# Patient Record
Sex: Female | Born: 1990
Health system: Southern US, Community
[De-identification: ages and names within clinical notes are randomized; demographics above are authoritative.]

## PROBLEM LIST (undated history)

## (undated) ENCOUNTER — Inpatient Hospital Stay (HOSPITAL_COMMUNITY): Payer: Self-pay

## (undated) DIAGNOSIS — K5190 Ulcerative colitis, unspecified, without complications: Secondary | ICD-10-CM

## (undated) DIAGNOSIS — O26643 Intrahepatic cholestasis of pregnancy, third trimester: Secondary | ICD-10-CM

## (undated) DIAGNOSIS — Z8619 Personal history of other infectious and parasitic diseases: Secondary | ICD-10-CM

## (undated) DIAGNOSIS — K56609 Unspecified intestinal obstruction, unspecified as to partial versus complete obstruction: Secondary | ICD-10-CM

## (undated) DIAGNOSIS — F419 Anxiety disorder, unspecified: Secondary | ICD-10-CM

## (undated) DIAGNOSIS — O26613 Liver and biliary tract disorders in pregnancy, third trimester: Secondary | ICD-10-CM

## (undated) DIAGNOSIS — K602 Anal fissure, unspecified: Secondary | ICD-10-CM

## (undated) DIAGNOSIS — K589 Irritable bowel syndrome without diarrhea: Secondary | ICD-10-CM

## (undated) DIAGNOSIS — K831 Obstruction of bile duct: Secondary | ICD-10-CM

## (undated) DIAGNOSIS — G8929 Other chronic pain: Secondary | ICD-10-CM

## (undated) DIAGNOSIS — R519 Headache, unspecified: Secondary | ICD-10-CM

## (undated) HISTORY — DX: Anxiety disorder, unspecified: F41.9

## (undated) HISTORY — DX: Irritable bowel syndrome, unspecified: K58.9

## (undated) HISTORY — DX: Intrahepatic cholestasis of pregnancy, third trimester: O26.643

## (undated) HISTORY — DX: Anal fissure, unspecified: K60.2

## (undated) HISTORY — PX: OTHER SURGICAL HISTORY: SHX169

## (undated) HISTORY — DX: Ulcerative colitis, unspecified, without complications: K51.90

## (undated) HISTORY — DX: Personal history of other infectious and parasitic diseases: Z86.19

## (undated) HISTORY — DX: Other chronic pain: G89.29

## (undated) HISTORY — DX: Unspecified intestinal obstruction, unspecified as to partial versus complete obstruction: K56.609

## (undated) HISTORY — DX: Headache, unspecified: R51.9

## (undated) HISTORY — DX: Obstruction of bile duct: O26.613

## (undated) HISTORY — DX: Obstruction of bile duct: K83.1

---
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
## 2011-02-06 LAB — POCT URINALYSIS DIP (DEVICE)
Bilirubin Urine: NEGATIVE
Glucose, UA: NEGATIVE
Nitrite: NEGATIVE
Operator id: 247071
Specific Gravity, Urine: 1.01
Urobilinogen, UA: 1

## 2011-02-06 LAB — POCT PREGNANCY, URINE: Preg Test, Ur: NEGATIVE

## 2014-04-27 NOTE — L&D Delivery Note (Signed)
Delivery Note At 6:53 PM a viable female was delivered via  (Presentation: ;  ).  APGAR: , ; weight  .   Placenta status: , .  Cord:  with the following complications: .  Cord pH: not done  Anesthesia:   Episiotomy:   Lacerations:   Suture Repair: 2.0 vicryl Est. Blood Loss (mL):    Mom to postpartum.  Baby to Couplet care / Skin to Skin.  MARSHALL,BERNARD A 12/10/2014, 7:05 PM

## 2014-05-31 ENCOUNTER — Other Ambulatory Visit (HOSPITAL_COMMUNITY): Payer: Self-pay | Admitting: Obstetrics

## 2014-05-31 ENCOUNTER — Encounter (HOSPITAL_COMMUNITY): Payer: Self-pay

## 2014-05-31 ENCOUNTER — Ambulatory Visit (HOSPITAL_COMMUNITY)
Admission: RE | Admit: 2014-05-31 | Discharge: 2014-05-31 | Disposition: A | Discharge status: Home / Self Care | Payer: Self-pay | Source: Ambulatory Visit | Attending: Obstetrics | Admitting: Obstetrics

## 2014-05-31 DIAGNOSIS — R7611 Nonspecific reaction to tuberculin skin test without active tuberculosis: Secondary | ICD-10-CM | POA: Insufficient documentation

## 2014-05-31 DIAGNOSIS — J8410 Pulmonary fibrosis, unspecified: Secondary | ICD-10-CM | POA: Insufficient documentation

## 2014-05-31 DIAGNOSIS — O9989 Other specified diseases and conditions complicating pregnancy, childbirth and the puerperium: Secondary | ICD-10-CM | POA: Insufficient documentation

## 2014-05-31 DIAGNOSIS — Z3A08 8 weeks gestation of pregnancy: Secondary | ICD-10-CM | POA: Insufficient documentation

## 2014-05-31 DIAGNOSIS — Z111 Encounter for screening for respiratory tuberculosis: Secondary | ICD-10-CM

## 2014-05-31 LAB — OB RESULTS CONSOLE ABO/RH: RH TYPE: POSITIVE

## 2014-05-31 LAB — OB RESULTS CONSOLE GC/CHLAMYDIA
CHLAMYDIA, DNA PROBE: NEGATIVE
GC PROBE AMP, GENITAL: NEGATIVE

## 2014-05-31 LAB — OB RESULTS CONSOLE RUBELLA ANTIBODY, IGM: Rubella: IMMUNE

## 2014-05-31 LAB — OB RESULTS CONSOLE RPR: RPR: NONREACTIVE

## 2014-05-31 LAB — OB RESULTS CONSOLE HEPATITIS B SURFACE ANTIGEN: Hepatitis B Surface Ag: NEGATIVE

## 2014-05-31 LAB — OB RESULTS CONSOLE HIV ANTIBODY (ROUTINE TESTING): HIV: NONREACTIVE

## 2014-05-31 LAB — OB RESULTS CONSOLE ANTIBODY SCREEN: ANTIBODY SCREEN: NEGATIVE

## 2014-07-21 ENCOUNTER — Encounter (HOSPITAL_COMMUNITY): Payer: Self-pay

## 2014-07-21 ENCOUNTER — Inpatient Hospital Stay (HOSPITAL_COMMUNITY)
Admission: AD | Admit: 2014-07-21 | Discharge: 2014-07-21 | Disposition: A | Discharge status: Home / Self Care | Payer: Self-pay | Source: Ambulatory Visit | Attending: Obstetrics | Admitting: Obstetrics

## 2014-07-21 DIAGNOSIS — O9989 Other specified diseases and conditions complicating pregnancy, childbirth and the puerperium: Secondary | ICD-10-CM | POA: Insufficient documentation

## 2014-07-21 DIAGNOSIS — Z3492 Encounter for supervision of normal pregnancy, unspecified, second trimester: Secondary | ICD-10-CM

## 2014-07-21 DIAGNOSIS — Z3A16 16 weeks gestation of pregnancy: Secondary | ICD-10-CM | POA: Insufficient documentation

## 2014-07-21 DIAGNOSIS — J029 Acute pharyngitis, unspecified: Secondary | ICD-10-CM

## 2014-07-21 LAB — URINALYSIS, ROUTINE W REFLEX MICROSCOPIC
BILIRUBIN URINE: NEGATIVE
Glucose, UA: NEGATIVE mg/dL
HGB URINE DIPSTICK: NEGATIVE
KETONES UR: NEGATIVE mg/dL
Leukocytes, UA: NEGATIVE
NITRITE: NEGATIVE
PH: 6 (ref 5.0–8.0)
PROTEIN: NEGATIVE mg/dL
Urobilinogen, UA: 0.2 mg/dL (ref 0.0–1.0)

## 2014-07-21 LAB — RAPID STREP SCREEN (MED CTR MEBANE ONLY): Streptococcus, Group A Screen (Direct): NEGATIVE

## 2014-07-21 MED ORDER — IBUPROFEN 600 MG PO TABS
600.0000 mg | ORAL_TABLET | Freq: Once | ORAL | Status: AC
  Administered 2014-07-21: 600 mg via ORAL
  Filled 2014-07-21: qty 1

## 2014-07-21 NOTE — Discharge Instructions (Signed)
Pharyngitis  Pharyngitis is a sore throat (pharynx). There is redness, pain, and swelling of your throat.  HOME CARE   · Drink enough fluids to keep your pee (urine) clear or pale yellow.  · Only take medicine as told by your doctor.  · You may get sick again if you do not take medicine as told. Finish your medicines, even if you start to feel better.  · Do not take aspirin.  · Rest.  · Rinse your mouth (gargle) with salt water (½ tsp of salt per 1 qt of water) every 1-2 hours. This will help the pain.  · If you are not at risk for choking, you can suck on hard candy or sore throat lozenges.  GET HELP IF:  · You have large, tender lumps on your neck.  · You have a rash.  · You cough up green, yellow-brown, or bloody spit.  GET HELP RIGHT AWAY IF:   · You have a stiff neck.  · You drool or cannot swallow liquids.  · You throw up (vomit) or are not able to keep medicine or liquids down.  · You have very bad pain that does not go away with medicine.  · You have problems breathing (not from a stuffy nose).  MAKE SURE YOU:   · Understand these instructions.  · Will watch your condition.  · Will get help right away if you are not doing well or get worse.  Document Released: 09/30/2007 Document Revised: 02/01/2013 Document Reviewed: 12/19/2012  ExitCare® Patient Information ©2015 [employer]. This information is not intended to replace advice given to you by your health care provider. Make sure you discuss any questions you have with your health care provider.  Strep Throat  Strep throat is an infection of the throat caused by a bacteria named Streptococcus pyogenes. Your health care provider may call the infection streptococcal "tonsillitis" or "pharyngitis" depending on whether there are signs of inflammation in the tonsils or back of the throat. Strep throat is most common in children aged 5-15 years during the cold months of the year, but it can occur in people of any age during any season. This infection is spread  from person to person (contagious) through coughing, sneezing, or other close contact.  SIGNS AND SYMPTOMS   · Fever or chills.  · Painful, swollen, red tonsils or throat.  · Pain or difficulty when swallowing.  · White or yellow spots on the tonsils or throat.  · Swollen, tender lymph nodes or "glands" of the neck or under the jaw.  · Red rash all over the body (rare).  DIAGNOSIS   Many different infections can cause the same symptoms. A test must be done to confirm the diagnosis so the right treatment can be given. A "rapid strep test" can help your health care provider make the diagnosis in a few minutes. If this test is not available, a light swab of the infected area can be used for a throat culture test. If a throat culture test is done, results are usually available in a day or two.  TREATMENT   Strep throat is treated with antibiotic medicine.  HOME CARE INSTRUCTIONS   · Gargle with 1 tsp of salt in 1 cup of warm water, 3-4 times per day or as needed for comfort.  · Family members who also have a sore throat or fever should be tested for strep throat and treated with antibiotics if they have the strep infection.  ·   throat gets better.  Drink enough water and fluids to keep your urine clear or pale yellow. This will help prevent dehydration.  Get plenty of rest.  Stay home from school, day care, or work until you have been on antibiotics for 24 hours.  Take medicines only as directed by your health care provider.  Take your antibiotic medicine as directed by your health care provider. Finish it even if you start to feel better. SEEK MEDICAL CARE IF:   The glands in your neck continue to enlarge.  You develop a rash, cough, or earache.  You cough up green, yellow-brown, or  bloody sputum.  You have pain or discomfort not controlled by medicines.  Your problems seem to be getting worse rather than better.  You have a fever. SEEK IMMEDIATE MEDICAL CARE IF:   You develop any new symptoms such as vomiting, severe headache, stiff or painful neck, chest pain, shortness of breath, or trouble swallowing.  You develop severe throat pain, drooling, or changes in your voice.  You develop swelling of the neck, or the skin on the neck becomes red and tender.  You develop signs of dehydration, such as fatigue, dry mouth, and decreased urination.  You become increasingly sleepy, or you cannot wake up completely. MAKE SURE YOU:  Understand these instructions.  Will watch your condition.  Will get help right away if you are not doing well or get worse. Document Released: 04/10/2000 Document Revised: 08/28/2013 Document Reviewed: 06/12/2010 [facility] Patient Information 2015 [employer], [place]. This information is not intended to replace advice given to you by your health care provider. Make sure you discuss any questions you have with your health care provider.

## 2014-07-21 NOTE — MAU Provider Note (Signed)
  History     CSN: [id number]  Arrival date and time: 07/21/14 1253   First Provider Initiated Contact with Patient 07/21/14 1350      Chief Complaint  Patient presents with  . Sore Throat   HPI Alicia Cochran is 24 y.o. G1P0 2400w0d weeks presenting with sore throat that began 3 days ago.  Feels like the inside of her throat is swollen.  Warm drink helps at the time she drinks but then pain return.  Rates pain as 8/10. Took Tylenol X 1 yesterday without any sxs relief. Difficulty sleeping because of pain.   She is a patient of Dr. Gaynell FaceMarshall, has next appointment 3/29.  Denies runny nose, fever, itchy eyes.      History reviewed. No pertinent past medical history.  History reviewed. No pertinent past surgical history.  History reviewed. No pertinent family history.  History  Substance Use Topics  . Smoking status: Never Smoker   . Smokeless tobacco: Never Used  . Alcohol Use: No    Allergies: No Known Allergies  No prescriptions prior to admission    Review of Systems  Constitutional: Negative for fever and chills.  HENT: Positive for sore throat.   Eyes: Negative for discharge and redness.  Respiratory: Negative for cough and shortness of breath.   Gastrointestinal: Negative for abdominal pain.  Neurological: Negative for headaches.   Physical Exam   Blood pressure 109/65, pulse 81, temperature 98.7 F (37.1 C), temperature source Oral, resp. rate 18, height 4\' 11"  (1.499 m), weight 113 lb (51.256 kg).  Physical Exam  Nursing note and vitals reviewed. Constitutional: She appears well-developed and well-nourished. No distress.  HENT:  Head: Normocephalic.  Nose: No rhinorrhea.  Mouth/Throat: Mucous membranes are pale, not dry and not cyanotic. No uvula swelling. Posterior oropharyngeal erythema present. No oropharyngeal exudate, posterior oropharyngeal edema or tonsillar abscesses.  Neck: Normal range of motion.  Cardiovascular: Normal rate.   Respiratory:  Effort normal.  GI: Soft.   URINALYSIS:  SP 1.005, Neg RAPID STREPT SCREEN:  NEG MAU Course  Procedures  STept testing in progress                       Ibuprofen 600mg  po X 1 dose given in MAU-patient instructed to take Tylenol at home.  MDM Will discharge to home with instructions that we will call with Stept test results.  Will give 1 dose of Ibuprofen 600mg  po here.  May use throat lozengers. Warm salt water gargles.  Report worsening sxs to Dr. Gaynell FaceMarshall.     Assessment and Plan  A:  Pharyngitis--Strept was ruled out      Seasonal allergies      [redacted] weeks gestation without pregnancy related sxs  P:  Strept testing will be reported       Warm water gargle, OTC lozengers      Tylenol for pain  16:10 Reported Neg Strept results to patient. KEY,EVE M 07/21/2014, 4:05 PM

## 2014-07-21 NOTE — MAU Note (Signed)
Pt states here for sore throat x3 days as well as swollen lymph nodes. Denies other issues with pregnancy. No bleeding or vaginal discharge.

## 2014-07-23 ENCOUNTER — Encounter (HOSPITAL_COMMUNITY): Payer: Self-pay

## 2014-07-25 LAB — CULTURE, GROUP A STREP: Strep A Culture: NEGATIVE

## 2014-11-26 ENCOUNTER — Inpatient Hospital Stay (HOSPITAL_COMMUNITY): Admission: AD | Admit: 2014-11-26 | Payer: Self-pay | Source: Ambulatory Visit | Admitting: Obstetrics

## 2014-11-26 ENCOUNTER — Ambulatory Visit: Payer: Self-pay | Attending: Internal Medicine

## 2014-12-03 ENCOUNTER — Other Ambulatory Visit (HOSPITAL_COMMUNITY): Payer: Self-pay | Admitting: Obstetrics

## 2014-12-03 DIAGNOSIS — O26643 Intrahepatic cholestasis of pregnancy, third trimester: Secondary | ICD-10-CM

## 2014-12-03 DIAGNOSIS — K831 Obstruction of bile duct: Secondary | ICD-10-CM

## 2014-12-03 DIAGNOSIS — O26613 Liver and biliary tract disorders in pregnancy, third trimester: Secondary | ICD-10-CM

## 2014-12-03 DIAGNOSIS — Z3A35 35 weeks gestation of pregnancy: Secondary | ICD-10-CM

## 2014-12-03 LAB — OB RESULTS CONSOLE GBS: STREP GROUP B AG: POSITIVE

## 2014-12-04 ENCOUNTER — Telehealth (HOSPITAL_COMMUNITY): Payer: Self-pay | Admitting: *Deleted

## 2014-12-04 ENCOUNTER — Ambulatory Visit (HOSPITAL_COMMUNITY)
Admission: RE | Admit: 2014-12-04 | Discharge: 2014-12-04 | Disposition: A | Discharge status: Home / Self Care | Payer: Self-pay | Source: Ambulatory Visit | Attending: Obstetrics | Admitting: Obstetrics

## 2014-12-04 ENCOUNTER — Encounter (HOSPITAL_COMMUNITY): Payer: Self-pay

## 2014-12-04 ENCOUNTER — Encounter (HOSPITAL_COMMUNITY): Payer: Self-pay | Admitting: *Deleted

## 2014-12-04 DIAGNOSIS — Z3A00 Weeks of gestation of pregnancy not specified: Secondary | ICD-10-CM | POA: Insufficient documentation

## 2014-12-04 DIAGNOSIS — Z3493 Encounter for supervision of normal pregnancy, unspecified, third trimester: Secondary | ICD-10-CM | POA: Insufficient documentation

## 2014-12-04 NOTE — Telephone Encounter (Signed)
Preadmission screen Interpreter number [phone]

## 2014-12-07 ENCOUNTER — Ambulatory Visit (HOSPITAL_COMMUNITY)
Admission: RE | Admit: 2014-12-07 | Discharge: 2014-12-07 | Disposition: A | Discharge status: Home / Self Care | Payer: Self-pay | Source: Ambulatory Visit | Attending: Obstetrics | Admitting: Obstetrics

## 2014-12-07 DIAGNOSIS — Z3A35 35 weeks gestation of pregnancy: Secondary | ICD-10-CM | POA: Insufficient documentation

## 2014-12-07 DIAGNOSIS — O26613 Liver and biliary tract disorders in pregnancy, third trimester: Secondary | ICD-10-CM | POA: Insufficient documentation

## 2014-12-07 DIAGNOSIS — K831 Obstruction of bile duct: Secondary | ICD-10-CM

## 2014-12-10 ENCOUNTER — Encounter (HOSPITAL_COMMUNITY): Payer: Self-pay

## 2014-12-10 ENCOUNTER — Inpatient Hospital Stay (HOSPITAL_COMMUNITY): Payer: Medicaid Other | Admitting: Anesthesiology

## 2014-12-10 ENCOUNTER — Inpatient Hospital Stay (HOSPITAL_COMMUNITY)
Admission: RE | Admit: 2014-12-10 | Discharge: 2014-12-12 | DRG: 775 | Disposition: A | Discharge status: Home / Self Care | Payer: Medicaid Other | Source: Ambulatory Visit | Attending: Obstetrics | Admitting: Obstetrics

## 2014-12-10 DIAGNOSIS — O99824 Streptococcus B carrier state complicating childbirth: Principal | ICD-10-CM | POA: Diagnosis present

## 2014-12-10 DIAGNOSIS — K831 Obstruction of bile duct: Secondary | ICD-10-CM | POA: Diagnosis present

## 2014-12-10 DIAGNOSIS — O26619 Liver and biliary tract disorders in pregnancy, unspecified trimester: Secondary | ICD-10-CM

## 2014-12-10 DIAGNOSIS — O2662 Liver and biliary tract disorders in childbirth: Secondary | ICD-10-CM | POA: Diagnosis present

## 2014-12-10 DIAGNOSIS — Z3A37 37 weeks gestation of pregnancy: Secondary | ICD-10-CM | POA: Diagnosis present

## 2014-12-10 LAB — ABO/RH: ABO/RH(D): O POS

## 2014-12-10 LAB — CBC
HEMATOCRIT: 40.1 % (ref 36.0–46.0)
HEMOGLOBIN: 14.2 g/dL (ref 12.0–15.0)
MCH: 33.3 pg (ref 26.0–34.0)
MCHC: 35.4 g/dL (ref 30.0–36.0)
MCV: 94.1 fL (ref 78.0–100.0)
Platelets: 150 10*3/uL (ref 150–400)
RBC: 4.26 MIL/uL (ref 3.87–5.11)
RDW: 14.4 % (ref 11.5–15.5)
WBC: 9.4 10*3/uL (ref 4.0–10.5)

## 2014-12-10 LAB — TYPE AND SCREEN
ABO/RH(D): O POS
ANTIBODY SCREEN: NEGATIVE

## 2014-12-10 LAB — RPR: RPR: NONREACTIVE

## 2014-12-10 MED ORDER — SENNOSIDES-DOCUSATE SODIUM 8.6-50 MG PO TABS
2.0000 | ORAL_TABLET | ORAL | Status: DC
  Administered 2014-12-10 – 2014-12-11 (×2): 2 via ORAL
  Filled 2014-12-10 (×2): qty 2

## 2014-12-10 MED ORDER — ONDANSETRON HCL 4 MG PO TABS: 4.0000 mg | ORAL_TABLET | ORAL | Status: DC | PRN

## 2014-12-10 MED ORDER — ACETAMINOPHEN 325 MG PO TABS
650.0000 mg | ORAL_TABLET | ORAL | Status: DC | PRN
  Administered 2014-12-10 – 2014-12-11 (×2): 650 mg via ORAL
  Filled 2014-12-10 (×2): qty 2

## 2014-12-10 MED ORDER — LANOLIN HYDROUS EX OINT: TOPICAL_OINTMENT | CUTANEOUS | Status: DC | PRN

## 2014-12-10 MED ORDER — DIPHENHYDRAMINE HCL 25 MG PO CAPS: 25.0000 mg | ORAL_CAPSULE | Freq: Four times a day (QID) | ORAL | Status: DC | PRN

## 2014-12-10 MED ORDER — PENICILLIN G POTASSIUM 5000000 UNITS IJ SOLR
5.0000 10*6.[IU] | Freq: Once | INTRAMUSCULAR | Status: AC
  Administered 2014-12-10: 5 10*6.[IU] via INTRAVENOUS
  Filled 2014-12-10: qty 5

## 2014-12-10 MED ORDER — LACTATED RINGERS IV SOLN
500.0000 mL | INTRAVENOUS | Status: DC | PRN
  Administered 2014-12-10: 1000 mL via INTRAVENOUS

## 2014-12-10 MED ORDER — FENTANYL 2.5 MCG/ML BUPIVACAINE 1/10 % EPIDURAL INFUSION (WH - ANES): 12.0000 mL/h | INTRAMUSCULAR | Status: DC | PRN

## 2014-12-10 MED ORDER — FLEET ENEMA 7-19 GM/118ML RE ENEM: 1.0000 | ENEMA | RECTAL | Status: DC | PRN

## 2014-12-10 MED ORDER — WITCH HAZEL-GLYCERIN EX PADS: 1.0000 "application " | MEDICATED_PAD | CUTANEOUS | Status: DC | PRN

## 2014-12-10 MED ORDER — ONDANSETRON HCL 4 MG/2ML IJ SOLN: 4.0000 mg | INTRAMUSCULAR | Status: DC | PRN

## 2014-12-10 MED ORDER — ZOLPIDEM TARTRATE 5 MG PO TABS: 5.0000 mg | ORAL_TABLET | Freq: Every evening | ORAL | Status: DC | PRN

## 2014-12-10 MED ORDER — FENTANYL 2.5 MCG/ML BUPIVACAINE 1/10 % EPIDURAL INFUSION (WH - ANES)
14.0000 mL/h | INTRAMUSCULAR | Status: DC | PRN
  Filled 2014-12-10: qty 125

## 2014-12-10 MED ORDER — TERBUTALINE SULFATE 1 MG/ML IJ SOLN: 0.2500 mg | Freq: Once | INTRAMUSCULAR | Status: DC | PRN

## 2014-12-10 MED ORDER — ACETAMINOPHEN 325 MG PO TABS: 650.0000 mg | ORAL_TABLET | ORAL | Status: DC | PRN

## 2014-12-10 MED ORDER — PROMETHAZINE HCL 25 MG/ML IJ SOLN: 25.0000 mg | Freq: Four times a day (QID) | INTRAMUSCULAR | Status: DC | PRN

## 2014-12-10 MED ORDER — OXYTOCIN BOLUS FROM INFUSION
500.0000 mL | INTRAVENOUS | Status: DC
  Administered 2014-12-10: 500 mL via INTRAVENOUS

## 2014-12-10 MED ORDER — ONDANSETRON HCL 4 MG/2ML IJ SOLN: 4.0000 mg | Freq: Four times a day (QID) | INTRAMUSCULAR | Status: DC | PRN

## 2014-12-10 MED ORDER — DIPHENHYDRAMINE HCL 50 MG/ML IJ SOLN: 12.5000 mg | INTRAMUSCULAR | Status: DC | PRN

## 2014-12-10 MED ORDER — LACTATED RINGERS IV SOLN
INTRAVENOUS | Status: DC
  Administered 2014-12-10 (×3): via INTRAVENOUS

## 2014-12-10 MED ORDER — FENTANYL 2.5 MCG/ML BUPIVACAINE 1/10 % EPIDURAL INFUSION (WH - ANES)
INTRAMUSCULAR | Status: DC | PRN
  Administered 2014-12-10: 12 mL/h via EPIDURAL

## 2014-12-10 MED ORDER — NALBUPHINE HCL 10 MG/ML IJ SOLN: 10.0000 mg | INTRAMUSCULAR | Status: DC | PRN

## 2014-12-10 MED ORDER — OXYCODONE-ACETAMINOPHEN 5-325 MG PO TABS
2.0000 | ORAL_TABLET | ORAL | Status: DC | PRN
Start: 2014-12-10 — End: 2014-12-12

## 2014-12-10 MED ORDER — TETANUS-DIPHTH-ACELL PERTUSSIS 5-2.5-18.5 LF-MCG/0.5 IM SUSP: 0.5000 mL | Freq: Once | INTRAMUSCULAR | Status: DC

## 2014-12-10 MED ORDER — OXYCODONE-ACETAMINOPHEN 5-325 MG PO TABS: 2.0000 | ORAL_TABLET | ORAL | Status: DC | PRN

## 2014-12-10 MED ORDER — LIDOCAINE HCL (PF) 1 % IJ SOLN
30.0000 mL | INTRAMUSCULAR | Status: AC | PRN
  Administered 2014-12-10: 30 mL via SUBCUTANEOUS
  Filled 2014-12-10: qty 30

## 2014-12-10 MED ORDER — OXYCODONE-ACETAMINOPHEN 5-325 MG PO TABS: 1.0000 | ORAL_TABLET | ORAL | Status: DC | PRN

## 2014-12-10 MED ORDER — SIMETHICONE 80 MG PO CHEW: 80.0000 mg | CHEWABLE_TABLET | ORAL | Status: DC | PRN

## 2014-12-10 MED ORDER — PRENATAL MULTIVITAMIN CH
1.0000 | ORAL_TABLET | Freq: Every day | ORAL | Status: DC
  Administered 2014-12-11 – 2014-12-12 (×2): 1 via ORAL
  Filled 2014-12-10 (×2): qty 1

## 2014-12-10 MED ORDER — DIBUCAINE 1 % RE OINT: 1.0000 "application " | TOPICAL_OINTMENT | RECTAL | Status: DC | PRN

## 2014-12-10 MED ORDER — LIDOCAINE HCL (PF) 1 % IJ SOLN
INTRAMUSCULAR | Status: DC | PRN
  Administered 2014-12-10: 5 mL
  Administered 2014-12-10: 4 mL

## 2014-12-10 MED ORDER — CITRIC ACID-SODIUM CITRATE 334-500 MG/5ML PO SOLN: 30.0000 mL | ORAL | Status: DC | PRN

## 2014-12-10 MED ORDER — PHENYLEPHRINE 40 MCG/ML (10ML) SYRINGE FOR IV PUSH (FOR BLOOD PRESSURE SUPPORT)
80.0000 ug | PREFILLED_SYRINGE | INTRAVENOUS | Status: DC | PRN
  Filled 2014-12-10: qty 20

## 2014-12-10 MED ORDER — BENZOCAINE-MENTHOL 20-0.5 % EX AERO
1.0000 "application " | INHALATION_SPRAY | CUTANEOUS | Status: DC | PRN
  Administered 2014-12-10 – 2014-12-12 (×2): 1 via TOPICAL
  Filled 2014-12-10 (×2): qty 56

## 2014-12-10 MED ORDER — PENICILLIN G POTASSIUM 5000000 UNITS IJ SOLR
2.5000 10*6.[IU] | INTRAVENOUS | Status: DC
  Administered 2014-12-10 (×3): 2.5 10*6.[IU] via INTRAVENOUS
  Filled 2014-12-10 (×6): qty 2.5

## 2014-12-10 MED ORDER — OXYCODONE-ACETAMINOPHEN 5-325 MG PO TABS
1.0000 | ORAL_TABLET | ORAL | Status: DC | PRN
Start: 2014-12-10 — End: 2014-12-12

## 2014-12-10 MED ORDER — FERROUS SULFATE 325 (65 FE) MG PO TABS
325.0000 mg | ORAL_TABLET | Freq: Two times a day (BID) | ORAL | Status: DC
  Administered 2014-12-11 – 2014-12-12 (×3): 325 mg via ORAL
  Filled 2014-12-10 (×3): qty 1

## 2014-12-10 MED ORDER — OXYTOCIN 40 UNITS IN LACTATED RINGERS INFUSION - SIMPLE MED
1.0000 m[IU]/min | INTRAVENOUS | Status: DC
  Administered 2014-12-10: 1 m[IU]/min via INTRAVENOUS
  Filled 2014-12-10: qty 1000

## 2014-12-10 MED ORDER — IBUPROFEN 600 MG PO TABS
600.0000 mg | ORAL_TABLET | Freq: Four times a day (QID) | ORAL | Status: DC
  Administered 2014-12-10 – 2014-12-12 (×7): 600 mg via ORAL
  Filled 2014-12-10 (×7): qty 1

## 2014-12-10 MED ORDER — EPHEDRINE 5 MG/ML INJ: 10.0000 mg | INTRAVENOUS | Status: DC | PRN

## 2014-12-10 MED ORDER — OXYTOCIN 40 UNITS IN LACTATED RINGERS INFUSION - SIMPLE MED
62.5000 mL/h | INTRAVENOUS | Status: DC
  Administered 2014-12-10: 62.5 mL/h via INTRAVENOUS

## 2014-12-10 NOTE — H&P (Signed)
This is Dr. Francoise Ceo dictating the history and physical on  Alicia Cochran  she is a 24 year old gravida 2 para 0010 at 37 weeks and 3 days EDC 92 positive GBS for which she received penicillin patient has cholestasis of pregnancy increased bile acids and was followed by maternal-fetal medicine with nonstress test and she's in for induction cervix 2 cm 80% vertex -1 and amniotomy performed the fluids clear she is on low-dose Pitocin she was also on Atarax 10 mg by mouth 4 times a day for itching Past surgical history negative Past medical history negative Social history no smoking drinking or alcohol use System review patient has had generalized itching for the past couple weeks and is on Atarax 10 mg every 4 hours Family history negative Physical exam well-developed female in no distress HEENT negative Lungs clear to P&A Heart regular rhythm no murmurs or gallops Breasts negative Abdomen term Pelvic as described above Extremities negative

## 2014-12-10 NOTE — Anesthesia Preprocedure Evaluation (Signed)
Anesthesia Evaluation  Patient identified by MRN, date of birth, ID band Patient awake and Patient confused    Reviewed: Allergy & Precautions, H&P , NPO status , Patient's Chart, lab work & pertinent test results  Airway Mallampati: II       Dental   Pulmonary  breath sounds clear to auscultation  Pulmonary exam normal       Cardiovascular Exercise Tolerance: Good Normal cardiovascular examRhythm:regular Rate:Normal     Neuro/Psych    GI/Hepatic   Endo/Other    Renal/GU      Musculoskeletal   Abdominal   Peds  Hematology   Anesthesia Other Findings   Reproductive/Obstetrics (+) Pregnancy                             Anesthesia Physical Anesthesia Plan  ASA: II  Anesthesia Plan: Epidural   Post-op Pain Management:    Induction:   Airway Management Planned:   Additional Equipment:   Intra-op Plan:   Post-operative Plan:   Informed Consent: I have reviewed the patients History and Physical, chart, labs and discussed the procedure including the risks, benefits and alternatives for the proposed anesthesia with the patient or authorized representative who has indicated his/her understanding and acceptance.     Plan Discussed with:   Anesthesia Plan Comments:         Anesthesia Quick Evaluation  

## 2014-12-10 NOTE — Anesthesia Procedure Notes (Signed)
Epidural Patient location during procedure: OB Start time: 12/10/2014 9:56 AM End time: 12/10/2014 10:10 AM  Staffing Anesthesiologist: Sebastian Ache  Preanesthetic Checklist Completed: patient identified, site marked, surgical consent, pre-op evaluation, timeout performed, IV checked, risks and benefits discussed and monitors and equipment checked  Epidural Patient position: sitting Prep: site prepped and draped and DuraPrep Patient monitoring: heart rate, continuous pulse ox and blood pressure Approach: midline Location: L3-L4 Injection technique: LOR air  Needle:  Needle type: Tuohy  Needle gauge: 17 G Needle length: 9 cm and 9 Needle insertion depth: 5 cm Catheter type: closed end flexible Catheter size: 19 Gauge Catheter at skin depth: 13.5 cm Test dose: negative  Assessment Events: blood not aspirated, injection not painful, no injection resistance, negative IV test and no paresthesia  Additional Notes   Patient tolerated the insertion well without complications.Reason for block:procedure for pain
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
## 2014-12-11 LAB — CBC
HCT: 35.2 % — ABNORMAL LOW (ref 36.0–46.0)
HEMOGLOBIN: 12 g/dL (ref 12.0–15.0)
MCH: 32.3 pg (ref 26.0–34.0)
MCHC: 34.1 g/dL (ref 30.0–36.0)
MCV: 94.6 fL (ref 78.0–100.0)
Platelets: 142 10*3/uL — ABNORMAL LOW (ref 150–400)
RBC: 3.72 MIL/uL — AB (ref 3.87–5.11)
RDW: 14.4 % (ref 11.5–15.5)
WBC: 11 10*3/uL — ABNORMAL HIGH (ref 4.0–10.5)
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
## 2014-12-11 NOTE — Progress Notes (Signed)
Patient ID: Alicia Cochran, female   DOB: January 24, 1991, 24 y.o.   MRN: [id number] Postpartum day one Blood pressure 102/69 respiration 18 pulse 80 afebrile Fundus firm Lochia moderate Legs negative Doing well

## 2014-12-11 NOTE — Anesthesia Postprocedure Evaluation (Signed)
Anesthesia Post Note  Patient: Alicia Cochran  Procedure(s) Performed: * No procedures listed *  Anesthesia type: Epidural  Patient location: Mother/Baby  Post pain: Pain level controlled  Post assessment: Post-op Vital signs reviewed  Last Vitals:  Filed Vitals:   12/11/14 0617  BP: 102/69  Pulse: 80  Temp: 36.6 C  Resp: 18    Post vital signs: Reviewed  Level of consciousness: awake  Complications: No apparent anesthesia complications

## 2014-12-11 NOTE — Lactation Note (Signed)
This note was copied from the chart of Alicia Sloane Palmer. Lactation Consultation Note  Mother resting holding baby in arms.  Exhausted.  Her mother will come to stay w/ her tonight to help w/ baby. Mother knows how to hand express and feels good about breastfeeding. Noted tightness under infants tongue causing some limited mobility not completing covering bottom gum when he sucks. Feels like biting. Performed suck training.  Suggest mother call for assistance w/ next feeding. Reviewed basics, STS  and cluster feeding and suggest she wait on pacifier use. Mom encouraged to feed baby 8-12 times/24 hours and with feeding cues.  Mom made aware of O/P services, breastfeeding support groups, community resources, and our phone # for post-discharge questions.     Patient Name: Alicia Cochran [id number] Date: 12/11/2014 Reason for consult: Initial assessment   Maternal Data Has patient been taught Hand Expression?: Yes Does the patient have breastfeeding experience prior to this delivery?: No  Feeding Feeding Type: Breast Fed Length of feed: 15 min  LATCH Score/Interventions                      Lactation Tools Discussed/Used     Consult Status Consult Status: Follow-up Date: 12/12/14 Follow-up type: In-patient    Dahlia Byes [facility] 12/11/2014, 4:30 PM

## 2014-12-12 NOTE — Progress Notes (Signed)
UR chart review completed.  

## 2014-12-12 NOTE — Discharge Summary (Signed)
Obstetric Discharge Summary Reason for Admission: induction of labor Prenatal Procedures: none Intrapartum Procedures: spontaneous vaginal delivery Postpartum Procedures: none Complications-Operative and Postpartum: none HEMOGLOBIN  Date Value Ref Range Status  12/11/2014 12.0 12.0 - 15.0 g/dL Final   HCT  Date Value Ref Range Status  12/11/2014 35.2* 36.0 - 46.0 % Final    Physical Exam:  General: alert Lochia: appropriate Uterine Fundus: firm Incision: healing well DVT Evaluation: No evidence of DVT seen on physical exam.  Discharge Diagnoses: Term Pregnancy-delivered  Discharge Information: Date: 12/12/2014 Activity: pelvic rest Diet: routine Medications: Percocet Condition: improved Instructions: refer to practice specific booklet Discharge to: home Follow-up Information    Follow up with Massiah Longanecker A, MD In 6 weeks.   Specialty:  Obstetrics and Gynecology   Contact information:   [address] [place] [address] [phone]       Newborn Data: Live born female  Birth Weight: 6 lb 11 oz (3033 g) APGAR: 5, 8  Home with mother.  Katrese Shell A 12/12/2014, 6:40 AM

## 2014-12-12 NOTE — Discharge Instructions (Signed)
Discharge instructions   You can wash your hair  Shower  Eat what you want  Drink what you want  See me in 6 weeks  Your ankles are going to swell more in the next 2 weeks than when pregnant  No sex for 6 weeks   Alicia Cochran A, MD 12/12/2014

## 2014-12-12 NOTE — Progress Notes (Signed)
Patient ID: Alicia Cochran, female   DOB: Jul 17, 1990, 24 y.o.   MRN: [id number] Postpartum day 2 Blood pressure 8755 respiration 16 rhythm and pulse 68 afebrile Fundus firm Lochia moderate Legs negative Doing well home today

## 2014-12-17 ENCOUNTER — Ambulatory Visit (HOSPITAL_COMMUNITY): Payer: Self-pay

## 2014-12-28 ENCOUNTER — Other Ambulatory Visit (HOSPITAL_COMMUNITY): Payer: Self-pay | Admitting: Obstetrics

## 2015-02-01 ENCOUNTER — Encounter: Payer: Self-pay | Admitting: Internal Medicine

## 2015-02-01 ENCOUNTER — Ambulatory Visit: Payer: Medicaid Other | Attending: Internal Medicine | Admitting: Internal Medicine

## 2015-02-01 VITALS — BP 101/63 | HR 65 | Temp 98.3°F | Resp 17 | Ht 61.0 in | Wt 119.6 lb

## 2015-02-01 DIAGNOSIS — L299 Pruritus, unspecified: Secondary | ICD-10-CM | POA: Insufficient documentation

## 2015-02-01 DIAGNOSIS — K831 Obstruction of bile duct: Secondary | ICD-10-CM | POA: Insufficient documentation

## 2015-02-01 DIAGNOSIS — Z2821 Immunization not carried out because of patient refusal: Secondary | ICD-10-CM | POA: Diagnosis not present

## 2015-02-01 DIAGNOSIS — L298 Other pruritus: Secondary | ICD-10-CM | POA: Diagnosis not present

## 2015-02-01 DIAGNOSIS — Z0001 Encounter for general adult medical examination with abnormal findings: Secondary | ICD-10-CM | POA: Insufficient documentation

## 2015-02-01 NOTE — Patient Instructions (Signed)
I will call you once I get blood test back and ultrasound results and let you know what else we need to do. I need lab results from Dr. Hillery Aldo sign release of records.

## 2015-02-01 NOTE — Progress Notes (Signed)
Patient here to establish care and for a physical Patient takes no prescribed medications Patient was diagnosed during her pregnancy with cholestasis and told the  Itching would go away after the birth but still complains of itching and would Like a referral to derm

## 2015-02-01 NOTE — Progress Notes (Signed)
Patient ID: Alicia Cochran, female   DOB: 12-13-1990, 24 y.o.   MRN: [id number]  JYN:[id number]  QMV:[id number]  DOB - [birth year]  CC:  Chief Complaint  Patient presents with  . Annual Exam       HPI: Alicia Cochran is a 24 y.o. female here today to establish medical care. Patient has a past medical history of Cholestasis in pregnancy while in her third trimester. Patient reports that she is not on any current medication and denies any surgical history. Patient is concerned about itching that has persisted since the delivery of her baby 8 weeks ago. She reports that her OB/GYN is Dr. Gaynell Face and on her 6 week follow up she was told that she needed a gallbladder ultrasound but it would be expensive for her. She is requesting further evaluation. She notes that she was given hydroxyzine but stopped taking the medication because it only made her tired.   Patient has No headache, No chest pain, No abdominal pain - No Nausea, No new weakness tingling or numbness, No Cough - SOB.  No Known Allergies Past Medical History  Diagnosis Date  . Hx of chlamydia infection   . Cholestasis of pregnancy in third trimester    No current outpatient prescriptions on file prior to visit.   No current facility-administered medications on file prior to visit.   Family History  Problem Relation Age of Onset  . Asthma Mother   . Diabetes Maternal Grandmother   . Alcohol abuse Neg Hx   . Arthritis Neg Hx   . Birth defects Neg Hx   . Cancer Neg Hx   . COPD Neg Hx   . Depression Neg Hx   . Drug abuse Neg Hx   . Early death Neg Hx   . Hearing loss Neg Hx   . Heart disease Neg Hx   . Hyperlipidemia Neg Hx   . Hypertension Neg Hx   . Kidney disease Neg Hx   . Learning disabilities Neg Hx   . Mental illness Neg Hx   . Mental retardation Neg Hx   . Miscarriages / Stillbirths Neg Hx   . Stroke Neg Hx   . Vision loss Neg Hx   . Varicose Veins Neg Hx    Social History   Social History    . Marital Status: Single    Spouse Name: N/A  . Number of Children: N/A  . Years of Education: N/A   Occupational History  . Not on file.   Social History Main Topics  . Smoking status: Never Smoker   . Smokeless tobacco: Never Used  . Alcohol Use: No  . Drug Use: No  . Sexual Activity: Yes   Other Topics Concern  . Not on file   Social History Narrative   ** Merged History Encounter **        Review of Systems: Constitutional: Negative for fever, chills, diaphoresis, activity change, appetite change and fatigue. HENT: Negative for ear pain, nosebleeds, congestion, facial swelling, rhinorrhea, neck pain, neck stiffness and ear discharge.  Eyes: Negative for pain, discharge, redness, itching and visual disturbance. Respiratory: Negative for cough, choking, chest tightness, shortness of breath, wheezing and stridor.  Cardiovascular: Negative for chest pain, palpitations and leg swelling. Gastrointestinal: Negative for abdominal distention. Genitourinary: Negative for dysuria, urgency, frequency, hematuria, flank pain, decreased urine volume, difficulty urinating and dyspareunia.  Musculoskeletal: Negative for back pain, joint swelling, arthralgia and gait problem. Neurological: Negative for dizziness, tremors, seizures, syncope, facial asymmetry, speech difficulty,  weakness, light-headedness, numbness and headaches.  Hematological: Negative for adenopathy. Does not bruise/bleed easily. Psychiatric/Behavioral: Negative for hallucinations, behavioral problems, confusion, dysphoric mood, decreased concentration and agitation.    Objective:   Filed Vitals:   02/01/15 1126  BP: 101/63  Pulse: 65  Temp: 98.3 F (36.8 C)  Resp: 17    Physical Exam  Constitutional: She is oriented to person, place, and time.  Eyes: No scleral icterus.  Cardiovascular: Normal rate, regular rhythm and normal heart sounds.   Pulmonary/Chest: Effort normal and breath sounds normal.   Abdominal: Soft. Bowel sounds are normal. She exhibits no distension.  No hepatomegaly  Neurological: She is alert and oriented to person, place, and time.  Skin: Skin is warm and dry.  No jaundice     Lab Results  Component Value Date   WBC 11.0* 12/11/2014   HGB 12.0 12/11/2014   HCT 35.2* 12/11/2014   MCV 94.6 12/11/2014   PLT 142* 12/11/2014   No results found for: CREATININE, BUN, NA, K, CL, CO2  No results found for: HGBA1C Lipid Panel  No results found for: CHOL, TRIG, HDL, CHOLHDL, VLDL, LDLCALC     Assessment and plan:   [name] was seen today for annual exam.  Diagnoses and all orders for this visit:  Cholestatic pruritus -     Hepatic Function Panel  Cholestasis -     US Abdomen Limited; Future Will have patient send records to Dr. Elsie Stain to compare lab results.  If levels are still elevated will start patient on cholestyramine or send to GI.  Refused influenza vaccine Educated patient on the importance of vaccination especially with infant in home. May benefit from passive immunity   Return if symptoms worsen or fail to improve.    Ambrose Finland, NP-C [facility] [phone] 02/01/2015, 11:37 AM

## 2015-02-02 LAB — HEPATIC FUNCTION PANEL
ALK PHOS: 91 U/L (ref 33–115)
ALT: 16 U/L (ref 6–29)
AST: 16 U/L (ref 10–30)
Albumin: 4.6 g/dL (ref 3.6–5.1)
BILIRUBIN DIRECT: 0.1 mg/dL (ref <=0.2)
BILIRUBIN TOTAL: 0.4 mg/dL (ref 0.2–1.2)
Indirect Bilirubin: 0.3 mg/dL (ref 0.2–1.2)
Total Protein: 6.9 g/dL (ref 6.1–8.1)

## 2015-02-06 ENCOUNTER — Other Ambulatory Visit: Payer: Self-pay | Admitting: Internal Medicine

## 2015-02-06 ENCOUNTER — Ambulatory Visit (HOSPITAL_COMMUNITY)
Admission: RE | Admit: 2015-02-06 | Discharge: 2015-02-06 | Disposition: A | Discharge status: Home / Self Care | Payer: Self-pay | Source: Ambulatory Visit | Attending: Internal Medicine | Admitting: Internal Medicine

## 2015-02-06 ENCOUNTER — Telehealth: Payer: Self-pay | Admitting: Internal Medicine

## 2015-02-06 DIAGNOSIS — K831 Obstruction of bile duct: Secondary | ICD-10-CM | POA: Insufficient documentation

## 2015-02-06 NOTE — Telephone Encounter (Signed)
Patient called to let PCP know that she had her ultrasound done already. Please f/u

## 2015-02-08 ENCOUNTER — Telehealth: Payer: Self-pay | Admitting: Internal Medicine

## 2015-02-08 ENCOUNTER — Telehealth: Payer: Self-pay

## 2015-02-08 NOTE — Telephone Encounter (Signed)
Patient does speak english Patient is aware of her normal US results

## 2015-02-08 NOTE — Telephone Encounter (Signed)
Patient called to let PCP know that she had her ultrasound done already and would like a call back. Please f/u

## 2015-02-08 NOTE — Telephone Encounter (Signed)
-----   Message from Ambrose FinlandValerie A Keck, NP sent at 02/08/2015 12:30 PM EDT ----- Normal ultrasound. And normal liver function test. Does she still have itching?

## 2015-03-14 ENCOUNTER — Telehealth: Payer: Self-pay | Admitting: Internal Medicine

## 2015-03-14 NOTE — Telephone Encounter (Signed)
Pt. Called requesting to speak to nurse because she sees blood in her bowel movement. Pt. Stated she is scared and does not know what to do. Please f/u with pt ASAP.

## 2015-03-19 ENCOUNTER — Telehealth: Payer: Self-pay

## 2015-03-19 NOTE — Telephone Encounter (Signed)
Returned patient phone call and patient stated she Is better now

## 2015-03-27 ENCOUNTER — Ambulatory Visit: Payer: Self-pay | Admitting: Internal Medicine

## 2016-10-02 ENCOUNTER — Ambulatory Visit: Payer: Self-pay

## 2016-10-02 ENCOUNTER — Ambulatory Visit: Payer: Self-pay | Admitting: Family Medicine

## 2016-10-06 ENCOUNTER — Ambulatory Visit: Payer: Self-pay | Attending: Internal Medicine | Admitting: Physician Assistant

## 2016-10-06 ENCOUNTER — Encounter: Payer: Self-pay | Admitting: Physician Assistant

## 2016-10-06 VITALS — BP 97/60 | HR 85 | Temp 98.5°F | Resp 16 | Ht 59.0 in | Wt 110.0 lb

## 2016-10-06 DIAGNOSIS — R5383 Other fatigue: Secondary | ICD-10-CM | POA: Insufficient documentation

## 2016-10-06 DIAGNOSIS — R1031 Right lower quadrant pain: Secondary | ICD-10-CM | POA: Insufficient documentation

## 2016-10-06 DIAGNOSIS — Z308 Encounter for other contraceptive management: Secondary | ICD-10-CM | POA: Insufficient documentation

## 2016-10-06 DIAGNOSIS — Z3002 Counseling and instruction in natural family planning to avoid pregnancy: Secondary | ICD-10-CM

## 2016-10-06 DIAGNOSIS — N3001 Acute cystitis with hematuria: Secondary | ICD-10-CM | POA: Insufficient documentation

## 2016-10-06 DIAGNOSIS — Z79899 Other long term (current) drug therapy: Secondary | ICD-10-CM | POA: Insufficient documentation

## 2016-10-06 LAB — POCT URINALYSIS DIPSTICK
Bilirubin, UA: NEGATIVE
Glucose, UA: NEGATIVE
Ketones, UA: NEGATIVE
NITRITE UA: NEGATIVE
PH UA: 7 (ref 5.0–8.0)
PROTEIN UA: 30
Spec Grav, UA: 1.01 (ref 1.010–1.025)
UROBILINOGEN UA: 0.2 U/dL

## 2016-10-06 LAB — POCT URINE PREGNANCY: PREG TEST UR: NEGATIVE

## 2016-10-06 MED ORDER — NITROFURANTOIN MONOHYD MACRO 100 MG PO CAPS: 100.0000 mg | ORAL_CAPSULE | Freq: Two times a day (BID) | ORAL | 0 refills | Status: DC

## 2016-10-06 MED ORDER — IBUPROFEN 600 MG PO TABS: 600.0000 mg | ORAL_TABLET | Freq: Three times a day (TID) | ORAL | 0 refills | Status: DC | PRN

## 2016-10-06 MED ORDER — FLUCONAZOLE 150 MG PO TABS: 150.0000 mg | ORAL_TABLET | Freq: Once | ORAL | 0 refills | Status: DC

## 2016-10-06 MED ORDER — FLUCONAZOLE 150 MG PO TABS: 150.0000 mg | ORAL_TABLET | Freq: Once | ORAL | 0 refills | Status: AC

## 2016-10-06 MED ORDER — LEVONORGESTREL-ETHINYL ESTRAD 0.1-20 MG-MCG PO TABS: 1.0000 | ORAL_TABLET | Freq: Every day | ORAL | 11 refills | Status: DC

## 2016-10-06 NOTE — Progress Notes (Signed)
Alicia Cochran, is a 26 y.o. female  BJY:[id number]  [id number]:[id number]  DOB - 1991-01-28  Subjective:  Chief Complaint and HPI: Alicia Cochran is a 26 y.o. female here today for RLQ pain and urinary frequency for 1 month that has worsened over the last week.  She was seen [facility] the health dept 8 days ago and had Pap/STI screening-all of which was negative.  She also says they checked her urine and it did not show infection at the time.  No f/c.  No vaginal discharge.  Pain is worse at night.  She hasn't taken anything for the pain.  Not on [facility] but wants to restart it.    She has also been experiencing some difficulty sleeping and feels fatigued.  Her appetite has been less than normal the last few days.  No N/V/D.  No changes in BM.  No melena/hematochezia.  Social History: works as a Research officer, political party 6 days/week.  Single mom of almost 2 yr old son  ROS:   Constitutional:  No f/c, No night sweats, No unexplained weight loss. EENT:  No vision changes, No blurry vision, No hearing changes. No mouth, throat, or ear problems.  Respiratory: No cough, No SOB Cardiac: No CP, no palpitations GI:  +RL abd pain, No N/V/D. GU: + Urinary s/sx-frequency without dysuria Musculoskeletal: No joint pain Neuro: No headache, no dizziness, no motor weakness.  Skin: No rash Endocrine:  No polydipsia. No polyuria.  Psych: Denies SI/HI  No problems updated.  ALLERGIES: No Known Allergies  PAST MEDICAL HISTORY: Past Medical History:  Diagnosis Date  . Cholestasis of pregnancy in third trimester   . Hx of chlamydia infection     MEDICATIONS AT HOME: Prior to Admission medications   Medication Sig Start Date End Date Taking? Authorizing Provider  fluconazole (DIFLUCAN) 150 MG tablet Take 1 tablet (150 mg total) by mouth once. 10/06/16 10/06/16  Anders Simmonds, PA-C  levonorgestrel-ethinyl estradiol (AVIANE,ALESSE,LESSINA) 0.1-20 MG-MCG tablet Take 1 tablet by mouth daily. 10/06/16   Anders Simmonds, PA-C  nitrofurantoin, macrocrystal-monohydrate, (MACROBID) 100 MG capsule Take 1 capsule (100 mg total) by mouth 2 (two) times daily. 10/06/16   Anders Simmonds, PA-C     Objective:  EXAM:   Vitals:   10/06/16 1126  BP: 97/60  Pulse: 85  Resp: 16  Temp: 98.5 F (36.9 C)  TempSrc: Oral  SpO2: 97%  Weight: 110 lb (49.9 kg)  Height: 4\' 11"  (1.499 m)    General appearance : A&OX3. NAD. Non-toxic-appearing HEENT: Atraumatic and Normocephalic.  PERRLA. EOM intact.  TM clear B. Mouth-MMM, post pharynx WNL w/o erythema, No PND. Neck: supple, no JVD. No cervical lymphadenopathy. No thyromegaly Chest/Lungs:  Breathing-non-labored, Good air entry bilaterally, breath sounds normal without rales, rhonchi, or wheezing  CVS: S1 S2 regular, no murmurs, gallops, rubs  Abdomen: Bowel sounds present, Non tender and not distended with no gaurding, rigidity or rebound.  Neg Murphy's, neg McBurney's.  Mild TTP in RLQ/suprapubic area Pelvic exam deferred bc done 1 week ago. Extremities: Bilateral Lower Ext shows no edema, both legs are warm to touch with = pulse throughout Neurology:  CN II-XII grossly intact, Non focal.   Psych:  TP linear. J/I WNL. Normal speech. Appropriate eye contact and affect.  Skin:  No Rash  Data Review No results found for: HGBA1C   Assessment & Plan   1. RLQ abdominal pain Non-acute abdomen/pelvis on exam - POCT urinalysis dipstick - POCT urine pregnancy - US Pelvis  Complete; Future - Comprehensive metabolic panel - CBC with Differential/Platelet  2. Counseling for birth control, natural family planning All STI and pap normal 1 week ago at HD. - levonorgestrel-ethinyl estradiol (AVIANE,ALESSE,LESSINA) 0.1-20 MG-MCG tablet; Take 1 tablet by mouth daily.  Dispense: 1 Package; Refill: 11  3. Acute cystitis with hematuria - Urine Culture - nitrofurantoin, macrocrystal-monohydrate, (MACROBID) 100 MG capsule; Take 1 capsule (100 mg total) by mouth 2  (two) times daily.  Dispense: 10 capsule; Refill: 0 - fluconazole (DIFLUCAN) 150 MG tablet; Take 1 tablet (150 mg total) by mouth once.  Dispense: 1 tablet; Refill: 0  4. Fatigue, unspecified type - Vitamin D, 25-hydroxy - TSH  Patient have been counseled extensively about nutrition and exercise  Return in about 6 weeks (around 11/17/2016) for assign PCP; f/up pelvic pain/labs.  The patient was given clear instructions to go to ER or return to medical center if symptoms don't improve, worsen or new problems develop. The patient verbalized understanding. The patient was told to call to get lab results if they haven't heard anything in the next week.     Georgian CoAngela McClung, PA-C [facility] [address] [place], [place] [phone]   10/06/2016, 11:50 AMPatient ID: Alicia QuamNathalie Cochran, female   DOB: 23-Oct-1990, 26 y.o.   MRN: [id number]

## 2016-10-07 ENCOUNTER — Other Ambulatory Visit: Payer: Self-pay | Admitting: Physician Assistant

## 2016-10-07 ENCOUNTER — Telehealth: Payer: Self-pay | Admitting: Physician Assistant

## 2016-10-07 DIAGNOSIS — E559 Vitamin D deficiency, unspecified: Secondary | ICD-10-CM

## 2016-10-07 LAB — CBC WITH DIFFERENTIAL/PLATELET
BASOS ABS: 0 10*3/uL (ref 0.0–0.2)
Basos: 0 %
EOS (ABSOLUTE): 0.1 10*3/uL (ref 0.0–0.4)
Eos: 2 %
Hematocrit: 39.7 % (ref 34.0–46.6)
Hemoglobin: 13.2 g/dL (ref 11.1–15.9)
IMMATURE GRANS (ABS): 0 10*3/uL (ref 0.0–0.1)
IMMATURE GRANULOCYTES: 0 %
LYMPHS: 49 %
Lymphocytes Absolute: 2.4 10*3/uL (ref 0.7–3.1)
MCH: 30.7 pg (ref 26.6–33.0)
MCHC: 33.2 g/dL (ref 31.5–35.7)
MCV: 92 fL (ref 79–97)
MONOS ABS: 0.3 10*3/uL (ref 0.1–0.9)
Monocytes: 5 %
Neutrophils Absolute: 2.2 10*3/uL (ref 1.4–7.0)
Neutrophils: 44 %
PLATELETS: 268 10*3/uL (ref 150–379)
RBC: 4.3 x10E6/uL (ref 3.77–5.28)
RDW: 14.2 % (ref 12.3–15.4)
WBC: 5.1 10*3/uL (ref 3.4–10.8)

## 2016-10-07 LAB — COMPREHENSIVE METABOLIC PANEL
A/G RATIO: 2 (ref 1.2–2.2)
ALK PHOS: 60 IU/L (ref 39–117)
ALT: 11 IU/L (ref 0–32)
AST: 13 IU/L (ref 0–40)
Albumin: 4.4 g/dL (ref 3.5–5.5)
BUN/Creatinine Ratio: 12 (ref 9–23)
BUN: 7 mg/dL (ref 6–20)
Bilirubin Total: 0.2 mg/dL (ref 0.0–1.2)
CHLORIDE: 104 mmol/L (ref 96–106)
CO2: 24 mmol/L (ref 20–29)
Calcium: 8.9 mg/dL (ref 8.7–10.2)
Creatinine, Ser: 0.58 mg/dL (ref 0.57–1.00)
GFR calc Af Amer: 148 mL/min/{1.73_m2} (ref >59)
GFR calc non Af Amer: 129 mL/min/{1.73_m2} (ref >59)
GLOBULIN, TOTAL: 2.2 g/dL (ref 1.5–4.5)
Glucose: 85 mg/dL (ref 65–99)
POTASSIUM: 3.8 mmol/L (ref 3.5–5.2)
SODIUM: 140 mmol/L (ref 134–144)
Total Protein: 6.6 g/dL (ref 6.0–8.5)

## 2016-10-07 LAB — VITAMIN D 25 HYDROXY (VIT D DEFICIENCY, FRACTURES): VIT D 25 HYDROXY: 15.9 ng/mL — AB (ref 30.0–100.0)

## 2016-10-07 LAB — TSH: TSH: 2.13 u[IU]/mL (ref 0.450–4.500)

## 2016-10-07 MED ORDER — VITAMIN D (ERGOCALCIFEROL) 1.25 MG (50000 UNIT) PO CAPS: 50000.0000 [IU] | ORAL_CAPSULE | ORAL | 0 refills | Status: DC

## 2016-10-07 NOTE — Telephone Encounter (Signed)
Patient received communication via MyChart stating Vitamin D is low and script was sent to pharmacy, patient would like to discuss in detail with nurse results.  Please follow up

## 2016-10-08 LAB — URINE CULTURE: ORGANISM ID, BACTERIA: NO GROWTH

## 2016-10-08 NOTE — Telephone Encounter (Signed)
MA informed patient of labs being normal except vitamin d being low. Patient advised to pickup Vitamin [id number] prescription from [facility]. Patient is also aware of receiving an additional call regarding the urine culture. Medical Assistant left message on patient's home and cell voicemail. Voicemail states to give a call back to [place] [facility] at [phone].

## 2016-10-08 NOTE — Telephone Encounter (Signed)
-----   Message from Anders SimmondsAngela M McClung, [place] sent at 10/07/2016  8:53 AM EDT ----- Please call patient and let them that their vitamin D is low.  This can contribute to muscle aches, anxiety, fatigue, and depression.  I have sent a prescription to the pharmacy for them to take once a week.  We will recheck this level in 3-4 months.  Her thyroid is normal. Her blood count, electrolytes, kidney function, blood sugar, and liver function are all normal.  I am still awaiting her urine culture results and she should continue her antibiotics. Thanks, Georgian CoAngela McClung, PA-C

## 2016-10-09 ENCOUNTER — Telehealth: Payer: Self-pay | Admitting: Physician Assistant

## 2016-10-09 ENCOUNTER — Ambulatory Visit (HOSPITAL_COMMUNITY)
Admission: RE | Admit: 2016-10-09 | Discharge: 2016-10-09 | Disposition: A | Discharge status: Home / Self Care | Payer: Self-pay | Source: Ambulatory Visit | Attending: Physician Assistant | Admitting: Physician Assistant

## 2016-10-09 DIAGNOSIS — R1031 Right lower quadrant pain: Secondary | ICD-10-CM

## 2016-10-09 NOTE — Telephone Encounter (Signed)
Transvaginal required with pelvic [place]

## 2016-10-12 ENCOUNTER — Other Ambulatory Visit: Payer: Self-pay | Admitting: Physician Assistant

## 2016-10-12 DIAGNOSIS — N3001 Acute cystitis with hematuria: Secondary | ICD-10-CM

## 2016-10-14 ENCOUNTER — Ambulatory Visit: Payer: Self-pay | Attending: Family Medicine | Admitting: Family Medicine

## 2016-10-14 ENCOUNTER — Ambulatory Visit: Payer: Self-pay | Admitting: Family Medicine

## 2016-10-14 VITALS — BP 98/64 | HR 69 | Temp 98.5°F | Resp 18 | Ht 59.0 in | Wt 110.8 lb

## 2016-10-14 DIAGNOSIS — R1031 Right lower quadrant pain: Secondary | ICD-10-CM | POA: Insufficient documentation

## 2016-10-14 DIAGNOSIS — K625 Hemorrhage of anus and rectum: Secondary | ICD-10-CM | POA: Insufficient documentation

## 2016-10-14 DIAGNOSIS — R102 Pelvic and perineal pain: Secondary | ICD-10-CM | POA: Insufficient documentation

## 2016-10-14 DIAGNOSIS — K5900 Constipation, unspecified: Secondary | ICD-10-CM | POA: Insufficient documentation

## 2016-10-14 DIAGNOSIS — Z79899 Other long term (current) drug therapy: Secondary | ICD-10-CM | POA: Insufficient documentation

## 2016-10-14 LAB — POCT URINALYSIS DIPSTICK
Bilirubin, UA: NEGATIVE
Glucose, UA: NEGATIVE
KETONES UA: NEGATIVE
Leukocytes, UA: NEGATIVE
Nitrite, UA: NEGATIVE
PH UA: 5.5 (ref 5.0–8.0)
PROTEIN UA: NEGATIVE
RBC UA: NEGATIVE
UROBILINOGEN UA: 0.2 U/dL

## 2016-10-14 LAB — POCT URINE PREGNANCY: PREG TEST UR: NEGATIVE

## 2016-10-14 MED ORDER — IBUPROFEN 800 MG PO TABS: 800.0000 mg | ORAL_TABLET | Freq: Three times a day (TID) | ORAL | 0 refills | Status: DC | PRN

## 2016-10-14 MED ORDER — DOCUSATE SODIUM 100 MG PO CAPS: 100.0000 mg | ORAL_CAPSULE | Freq: Every day | ORAL | 1 refills | Status: DC | PRN

## 2016-10-14 MED ORDER — HYDROCORTISONE 2.5 % RE CREA: 1.0000 "application " | TOPICAL_CREAM | Freq: Two times a day (BID) | RECTAL | 0 refills | Status: DC

## 2016-10-14 NOTE — Progress Notes (Signed)
Subjective:  Patient ID: Juan QuamNathalie Perez-Sanchez, female    DOB: 1990/05/08  Age: 26 y.o. MRN: [id number]  CC: Abdominal Pain   HPI Juan Quamathalie Perez-Sanchez presents for complaints of right lower abdominal pain. The pain is described as aching, cramping and pressure-like, and is moderate in intensity. Pain is located in the RLQ without radiation. Onset was 2 weeks ago. Symptoms have been unchanged since. Aggravating factors: activity. Reports ibuprofen use with mild relief of symptoms . Associated symptoms: dysuria and nausea. She denies any  She also reports one episode of  bright blood in toilet commode that began yesterday with bowel movement. She denies any hemorrhoids. She does reports constipation. Constipation factors include low fiber diet.   Outpatient Medications Prior to Visit  Medication Sig Dispense Refill  . levonorgestrel-ethinyl estradiol (AVIANE,ALESSE,LESSINA) 0.1-20 MG-MCG tablet Take 1 tablet by mouth daily. 1 Package 11  . nitrofurantoin, macrocrystal-monohydrate, (MACROBID) 100 MG capsule Take 1 capsule (100 mg total) by mouth 2 (two) times daily. 10 capsule 0  . Vitamin D, Ergocalciferol, (DRISDOL) 50000 units CAPS capsule Take 1 capsule (50,000 Units total) by mouth every 7 (seven) days. 16 capsule 0  . ibuprofen (ADVIL,MOTRIN) 600 MG tablet Take 1 tablet (600 mg total) by mouth every 8 (eight) hours as needed. 60 tablet 0   No facility-administered medications prior to visit.     ROS Review of Systems  Constitutional: Negative.   Eyes: Negative.   Respiratory: Negative.   Cardiovascular: Negative.   Gastrointestinal: Positive for abdominal pain.       Rectal bleeding  Genitourinary: Positive for pelvic pain. Negative for dysuria.  Musculoskeletal: Negative.   Skin: Negative.   Neurological: Negative.   Psychiatric/Behavioral: Negative.    Objective:  BP 98/64 (BP Location: Left Arm, Patient Position: Sitting, Cuff Size: Normal)   Pulse 69   Temp 98.5 F  (36.9 C) (Oral)   Resp 18   Ht 4\' 11"  (1.499 m)   Wt 110 lb 12.8 oz (50.3 kg)   LMP 10/03/2016 (Exact Date)   SpO2 98%   BMI 22.38 kg/m   BP/Weight 10/14/2016 10/06/2016 02/01/2015  Systolic BP 98 97 101  Diastolic BP 64 60 63  Wt. (Lbs) 110.8 110 119.6  BMI 22.38 22.22 22.61   Physical Exam  Constitutional: She appears well-developed and well-nourished.  Eyes: Conjunctivae are normal. Pupils are equal, round, and reactive to light.  Cardiovascular: Normal rate, regular rhythm, normal heart sounds and intact distal pulses.   Pulmonary/Chest: Effort normal and breath sounds normal.  Abdominal: Soft. Bowel sounds are normal. There is tenderness (pelvic / RLQ ).  Genitourinary: Vagina normal. Rectal exam shows external hemorrhoid and internal hemorrhoid. Rectal exam shows guaiac negative stool. Cervix exhibits no discharge. No vaginal discharge found.  Skin: Skin is warm and dry.  Nursing note and vitals reviewed.   Assessment & Plan:   Problem List Items Addressed This Visit    None    Visit Diagnoses    RLQ abdominal pain    -  Primary   Relevant Medications   ibuprofen (ADVIL,MOTRIN) 800 MG tablet   Other Relevant Orders   POCT urine pregnancy (Completed)   Urinalysis Dipstick (Completed)   Cervicovaginal ancillary only   Rectal bleeding       Relevant Orders   Hemoccult-1 Card (office)   Constipation, unspecified constipation type       Relevant Medications   docusate sodium (COLACE) 100 MG capsule   hydrocortisone (ANUSOL-HC) 2.5 % rectal cream  Pelvic pain       Previous labs and imaging unremarkable for any infectious process.    Will obtain testing to rule out PID.    Relevant Orders   Cervicovaginal ancillary only     Meds ordered this encounter  Medications  . docusate sodium (COLACE) 100 MG capsule    Sig: Take 1 capsule (100 mg total) by mouth daily as needed for mild constipation.    Dispense:  30 capsule    Refill:  1    Order Specific Question:    Supervising Provider    Answer:   Quentin Angst [id number]  . ibuprofen (ADVIL,MOTRIN) 800 MG tablet    Sig: Take 1 tablet (800 mg total) by mouth every 8 (eight) hours as needed for moderate pain or cramping (Take with food).    Dispense:  30 tablet    Refill:  0    Order Specific Question:   Supervising Provider    Answer:   Quentin Angst [id number]  . hydrocortisone (ANUSOL-HC) 2.5 % rectal cream    Sig: Place 1 application rectally 2 (two) times daily. For 7 days.    Dispense:  30 g    Refill:  0    Order Specific Question:   Supervising Provider    Answer:   Quentin Angst [id number]    Follow-up: Return if symptoms worsen or fail to improve.   Lizbeth Bark FNP

## 2016-10-14 NOTE — Progress Notes (Signed)
Patient is here for lower right abdomen pain

## 2016-10-14 NOTE — Patient Instructions (Addendum)
You will be contacted with your lab results.   Pelvic Pain, Female Pelvic pain is pain in your lower abdomen, below your belly button and between your hips. The pain may start suddenly (acute), keep coming back (recurring), or last a long time (chronic). Pelvic pain that lasts longer than six months is considered chronic. Pelvic pain may affect your:  Reproductive organs.  Urinary system.  Digestive tract.  Musculoskeletal system.  There are many potential causes of pelvic pain. Sometimes, the pain can be a result of digestive or urinary conditions, strained muscles or ligaments, or even reproductive conditions. Sometimes the cause of pelvic pain is not known. Follow these instructions at home:  Take over-the-counter and prescription medicines only as told by your health care provider.  Rest as told by your health care provider.  Do not have sex it if hurts.  Keep a journal of your pelvic pain. Write down: ? When the pain started. ? Where the pain is located. ? What seems to make the pain better or worse, such as food or your menstrual cycle. ? Any symptoms you have along with the pain.  Keep all follow-up visits as told by your health care provider. This is important. Contact a health care provider if:  Medicine does not help your pain.  Your pain comes back.  You have new symptoms.  You have abnormal vaginal discharge or bleeding, including bleeding after menopause.  You have a fever or chills.  You are constipated.  You have blood in your urine or stool.  You have foul-smelling urine.  You feel weak or lightheaded. Get help right away if:  You have sudden severe pain.  Your pain gets steadily worse.  You have severe pain along with fever, nausea, vomiting, or excessive sweating.  You lose consciousness. This information is not intended to replace advice given to you by your health care provider. Make sure you discuss any questions you have with your  health care provider. Document Released: 03/10/2004 Document Revised: 05/08/2015 Document Reviewed: 02/01/2015 Elsevier Interactive Patient Education  2018 Elsevier Inc. High-Fiber Diet Fiber, also called dietary fiber, is a type of carbohydrate found in fruits, vegetables, whole grains, and beans. A high-fiber diet can have many health benefits. Your health care provider may recommend a high-fiber diet to help:  Prevent constipation. Fiber can make your bowel movements more regular.  Lower your cholesterol.  Relieve hemorrhoids, uncomplicated diverticulosis, or irritable bowel syndrome.  Prevent overeating as part of a weight-loss plan.  Prevent heart disease, type 2 diabetes, and certain cancers.  What is my plan? The recommended daily intake of fiber includes:  38 grams for men under age 26.  30 grams for men over age 26.  25 grams for women under age 26.  21 grams for women over age 26.  You can get the recommended daily intake of dietary fiber by eating a variety of fruits, vegetables, grains, and beans. Your health care provider may also recommend a fiber supplement if it is not possible to get enough fiber through your diet. What do I need to know about a high-fiber diet?  Fiber supplements have not been widely studied for their effectiveness, so it is better to get fiber through food sources.  Always check the fiber content on thenutrition facts label of any prepackaged food. Look for foods that contain at least 5 grams of fiber per serving.  Ask your dietitian if you have questions about specific foods that are related to your condition,  especially if those foods are not listed in the following section.  Increase your daily fiber consumption gradually. Increasing your intake of dietary fiber too quickly may cause bloating, cramping, or gas.  Drink plenty of water. Water helps you to digest fiber. What foods can I eat? Grains Whole-grain breads. Multigrain cereal.  Oats and oatmeal. Brown rice. Barley. Bulgur wheat. Millet. Bran muffins. Popcorn. Rye wafer crackers. Vegetables Sweet potatoes. Spinach. Kale. Artichokes. Cabbage. Broccoli. Green peas. Carrots. Squash. Fruits Berries. Pears. Apples. Oranges. Avocados. Prunes and raisins. Dried figs. Meats and Other Protein Sources Navy, kidney, pinto, and soy beans. Split peas. Lentils. Nuts and seeds. Dairy Fiber-fortified yogurt. Beverages Fiber-fortified soy milk. Fiber-fortified orange juice. Other Fiber bars. The items listed above may not be a complete list of recommended foods or beverages. Contact your dietitian for more options. What foods are not recommended? Grains White bread. Pasta made with refined flour. White rice. Vegetables Fried potatoes. Canned vegetables. Well-cooked vegetables. Fruits Fruit juice. Cooked, strained fruit. Meats and Other Protein Sources Fatty cuts of meat. Fried Environmental education officer or fried fish. Dairy Milk. Yogurt. Cream cheese. Sour cream. Beverages Soft drinks. Other Cakes and pastries. Butter and oils. The items listed above may not be a complete list of foods and beverages to avoid. Contact your dietitian for more information. What are some tips for including high-fiber foods in my diet?  Eat a wide variety of high-fiber foods.  Make sure that half of all grains consumed each day are whole grains.  Replace breads and cereals made from refined flour or white flour with whole-grain breads and cereals.  Replace white rice with brown rice, bulgur wheat, or millet.  Start the day with a breakfast that is high in fiber, such as a cereal that contains at least 5 grams of fiber per serving.  Use beans in place of meat in soups, salads, or pasta.  Eat high-fiber snacks, such as berries, raw vegetables, nuts, or popcorn. This information is not intended to replace advice given to you by your health care provider. Make sure you discuss any questions you have with  your health care provider. Document Released: 04/13/2005 Document Revised: 09/19/2015 Document Reviewed: 09/26/2013 Elsevier Interactive Patient Education  2017 [employer].

## 2016-10-15 LAB — CERVICOVAGINAL ANCILLARY ONLY
Bacterial vaginitis: POSITIVE — AB
CHLAMYDIA, DNA PROBE: NEGATIVE
Candida vaginitis: NEGATIVE
NEISSERIA GONORRHEA: NEGATIVE
TRICH (WINDOWPATH): NEGATIVE

## 2016-10-15 NOTE — Telephone Encounter (Signed)
-----   Message from Anders SimmondsAngela M McClung, [place] sent at 10/11/2016  7:41 PM EDT ----- Your ultrasound was normal.  Please return to the clinic if you continue to have problems.  Thanks, Georgian CoAngela McClung, PA-C

## 2016-10-15 NOTE — Telephone Encounter (Signed)
Patient is aware of US being normal. Medical Assistant left message on patient's home and cell voicemail. Voicemail states to give a call back to [place] [facility] at [phone].

## 2016-10-20 ENCOUNTER — Telehealth: Payer: Self-pay | Admitting: Family Medicine

## 2016-10-20 NOTE — Telephone Encounter (Signed)
PT called to get the lab result, she would like to speak with someone today since last time it took a long time to get her result, please follow up

## 2016-10-21 ENCOUNTER — Other Ambulatory Visit: Payer: Self-pay | Admitting: Family Medicine

## 2016-10-21 DIAGNOSIS — B9689 Other specified bacterial agents as the cause of diseases classified elsewhere: Secondary | ICD-10-CM

## 2016-10-21 DIAGNOSIS — N760 Acute vaginitis: Principal | ICD-10-CM

## 2016-10-21 MED ORDER — METRONIDAZOLE 500 MG PO TABS: 500.0000 mg | ORAL_TABLET | Freq: Two times a day (BID) | ORAL | 0 refills | Status: DC

## 2016-10-21 NOTE — Telephone Encounter (Signed)
-----   Message from Lizbeth BarkMandesia R Hairston, FNP sent at 10/21/2016  3:29 PM EDT ----- -Herpes, Gonorrhea, Chlamydia,  Yeast, and Trichomonas were all negative. Bacterial vaginosis was positive. You will be prescribed metronidazole to treat. BV is caused by an overgrowth of germs in the vagina. To reduce your risk of developing BV don't douche, don't use scented soap or sprays, and use protection during sexual intercourse.

## 2016-10-21 NOTE — Telephone Encounter (Signed)
CMA call regarding lab results  Patient did not answer on # [phone] & no VM set up call on [phone] no pick up but left a VM stating the reason of the call & call back

## 2016-10-21 NOTE — Telephone Encounter (Signed)
PT called to get the lab result, she would like to speak with someone today since last time it took a long time to get her result, please advice to call patient & let her know about her results.

## 2016-10-21 NOTE — Telephone Encounter (Signed)
Labs are resulted and available.

## 2016-10-22 NOTE — Telephone Encounter (Signed)
Patient Return CMA call   Patient Verify DOB  Patient was aware and understood

## 2016-11-03 LAB — HEMOCCULT GUIAC POC 1CARD (OFFICE): Fecal Occult Blood, POC: NEGATIVE

## 2016-11-10 ENCOUNTER — Ambulatory Visit: Payer: Self-pay | Attending: Family Medicine | Admitting: Family Medicine

## 2016-11-10 ENCOUNTER — Encounter: Payer: Self-pay | Admitting: Family Medicine

## 2016-11-10 VITALS — BP 93/54 | HR 66 | Temp 97.7°F | Ht 59.0 in | Wt 107.6 lb

## 2016-11-10 DIAGNOSIS — R102 Pelvic and perineal pain: Secondary | ICD-10-CM | POA: Insufficient documentation

## 2016-11-10 DIAGNOSIS — G8929 Other chronic pain: Secondary | ICD-10-CM

## 2016-11-10 DIAGNOSIS — R1031 Right lower quadrant pain: Secondary | ICD-10-CM

## 2016-11-10 DIAGNOSIS — R634 Abnormal weight loss: Secondary | ICD-10-CM | POA: Insufficient documentation

## 2016-11-10 DIAGNOSIS — Z6821 Body mass index (BMI) 21.0-21.9, adult: Secondary | ICD-10-CM | POA: Insufficient documentation

## 2016-11-10 LAB — POCT URINALYSIS DIPSTICK
Bilirubin, UA: NEGATIVE
Glucose, UA: NEGATIVE
Ketones, UA: NEGATIVE
Leukocytes, UA: NEGATIVE
Nitrite, UA: NEGATIVE
PH UA: 6 (ref 5.0–8.0)
PROTEIN UA: NEGATIVE
RBC UA: NEGATIVE
UROBILINOGEN UA: 0.2 U/dL

## 2016-11-10 LAB — POCT URINE PREGNANCY: Preg Test, Ur: NEGATIVE

## 2016-11-10 NOTE — Progress Notes (Signed)
Subjective:  Patient ID: Alicia Cochran, female    DOB: 02/24/1991  Age: 26 y.o. MRN: [id number]  CC: Pelvic Pain   HPI Alicia Cochran presents for complaints of right lower abdominal pain. The pain is described as aching, cramping and pressure-like, and is moderate in intensity. Pain is located in the RLQ without radiation. Onset was several weeks ago. Symptoms have been unchanged since. Aggravating factors: activity. Reports ibuprofen use with mild relief of symptoms . Associated symptoms: abdominal bloating. She denies any nausea, vomiting, hematochezia, melena, or dysuria. Reports weight loss of 3 pounds.     Outpatient Medications Prior to Visit  Medication Sig Dispense Refill  . docusate sodium (COLACE) 100 MG capsule Take 1 capsule (100 mg total) by mouth daily as needed for mild constipation. (Patient not taking: Reported on 11/10/2016) 30 capsule 1  . hydrocortisone (ANUSOL-HC) 2.5 % rectal cream Place 1 application rectally 2 (two) times daily. For 7 days. (Patient not taking: Reported on 11/10/2016) 30 g 0  . ibuprofen (ADVIL,MOTRIN) 800 MG tablet Take 1 tablet (800 mg total) by mouth every 8 (eight) hours as needed for moderate pain or cramping (Take with food). (Patient not taking: Reported on 11/10/2016) 30 tablet 0  . levonorgestrel-ethinyl estradiol (AVIANE,ALESSE,LESSINA) 0.1-20 MG-MCG tablet Take 1 tablet by mouth daily. (Patient not taking: Reported on 11/10/2016) 1 Package 11  . metroNIDAZOLE (FLAGYL) 500 MG tablet Take 1 tablet (500 mg total) by mouth 2 (two) times daily. (Patient not taking: Reported on 11/10/2016) 14 tablet 0  . nitrofurantoin, macrocrystal-monohydrate, (MACROBID) 100 MG capsule Take 1 capsule (100 mg total) by mouth 2 (two) times daily. (Patient not taking: Reported on 11/10/2016) 10 capsule 0  . Vitamin D, Ergocalciferol, (DRISDOL) 50000 units CAPS capsule Take 1 capsule (50,000 Units total) by mouth every 7 (seven) days. (Patient not taking:  Reported on 11/10/2016) 16 capsule 0   No facility-administered medications prior to visit.     ROS Review of Systems  Constitutional: Negative.   HENT: Negative.   Eyes: Negative.   Respiratory: Negative.   Cardiovascular: Negative.   Gastrointestinal: Positive for abdominal pain.  Genitourinary: Negative.   Psychiatric/Behavioral: Negative.      Objective:  BP (!) 93/54   Pulse 66   Temp 97.7 F (36.5 C) (Oral)   Ht 4' 11"  (1.499 m)   Wt 107 lb 9.6 oz (48.8 kg)   LMP 10/31/2016   SpO2 98%   BMI 21.73 kg/m   BP/Weight 11/10/2016 10/14/2016 10/14/5091  Systolic BP 93 98 97  Diastolic BP 54 64 60  Wt. (Lbs) 107.6 110.8 110  BMI 21.73 22.38 22.22     Physical Exam  Constitutional: She appears well-developed and well-nourished.  HENT:  Head: Normocephalic and atraumatic.  Right Ear: External ear normal.  Left Ear: External ear normal.  Nose: Nose normal.  Mouth/Throat: Oropharynx is clear and moist.  Eyes: Pupils are equal, round, and reactive to light. Conjunctivae and EOM are normal.  Neck: Normal range of motion. Neck supple.  Cardiovascular: Normal rate, regular rhythm, normal heart sounds and intact distal pulses.   Pulmonary/Chest: Effort normal and breath sounds normal.  Abdominal: Soft. Bowel sounds are normal. There is tenderness (RUQ).  Skin: Skin is warm and dry.  Psychiatric: Her mood appears anxious. She expresses no homicidal and no suicidal ideation. She expresses no suicidal plans and no homicidal plans.  Nursing note and vitals reviewed.  Assessment & Plan:   Problem List Items Addressed This Visit  None    Visit Diagnoses    Chronic RLQ pain    -  Primary   Ongoing problem, symptoms remain unchanged despite interventions.    Will order CT of abdomen and pelvis.    Relevant Orders   POCT urine pregnancy (Completed)   POCT urinalysis dipstick (Completed)   Ambulatory referral to Gastroenterology   CMP14+EGFR (Completed)   CBC with  Differential (Completed)   CT ABDOMEN PELVIS WO CONTRAST   Weight loss       Relevant Orders   CBC with Differential (Completed)   TSH (Completed)       Follow-up: Return if symptoms worsen or fail to improve.   Alfonse Spruce FNP

## 2016-11-10 NOTE — Patient Instructions (Signed)

## 2016-11-11 LAB — CBC WITH DIFFERENTIAL/PLATELET
BASOS ABS: 0 10*3/uL (ref 0.0–0.2)
BASOS: 0 %
EOS (ABSOLUTE): 0.1 10*3/uL (ref 0.0–0.4)
EOS: 1 %
HEMATOCRIT: 41.1 % (ref 34.0–46.6)
HEMOGLOBIN: 13.7 g/dL (ref 11.1–15.9)
IMMATURE GRANS (ABS): 0 10*3/uL (ref 0.0–0.1)
Immature Granulocytes: 0 %
LYMPHS ABS: 2.5 10*3/uL (ref 0.7–3.1)
LYMPHS: 40 %
MCH: 31.3 pg (ref 26.6–33.0)
MCHC: 33.3 g/dL (ref 31.5–35.7)
MCV: 94 fL (ref 79–97)
MONOCYTES: 4 %
Monocytes Absolute: 0.3 10*3/uL (ref 0.1–0.9)
NEUTROS ABS: 3.4 10*3/uL (ref 1.4–7.0)
Neutrophils: 55 %
Platelets: 267 10*3/uL (ref 150–379)
RBC: 4.38 x10E6/uL (ref 3.77–5.28)
RDW: 14 % (ref 12.3–15.4)
WBC: 6.2 10*3/uL (ref 3.4–10.8)

## 2016-11-11 LAB — CMP14+EGFR
ALBUMIN: 4.6 g/dL (ref 3.5–5.5)
ALT: 39 IU/L — AB (ref 0–32)
AST: 21 IU/L (ref 0–40)
Albumin/Globulin Ratio: 2.1 (ref 1.2–2.2)
Alkaline Phosphatase: 50 IU/L (ref 39–117)
BUN/Creatinine Ratio: 18 (ref 9–23)
BUN: 11 mg/dL (ref 6–20)
Bilirubin Total: 0.3 mg/dL (ref 0.0–1.2)
CALCIUM: 9.3 mg/dL (ref 8.7–10.2)
CO2: 23 mmol/L (ref 20–29)
Chloride: 102 mmol/L (ref 96–106)
Creatinine, Ser: 0.6 mg/dL (ref 0.57–1.00)
GFR, EST AFRICAN AMERICAN: 147 mL/min/{1.73_m2} (ref >59)
GFR, EST NON AFRICAN AMERICAN: 127 mL/min/{1.73_m2} (ref >59)
Globulin, Total: 2.2 g/dL (ref 1.5–4.5)
Glucose: 76 mg/dL (ref 65–99)
Potassium: 4 mmol/L (ref 3.5–5.2)
Sodium: 141 mmol/L (ref 134–144)
TOTAL PROTEIN: 6.8 g/dL (ref 6.0–8.5)

## 2016-11-11 LAB — TSH: TSH: 1.55 u[IU]/mL (ref 0.450–4.500)

## 2016-11-14 ENCOUNTER — Encounter: Payer: Self-pay | Admitting: Family Medicine

## 2016-11-16 ENCOUNTER — Other Ambulatory Visit: Payer: Self-pay | Admitting: Family Medicine

## 2016-11-17 ENCOUNTER — Ambulatory Visit (HOSPITAL_COMMUNITY)
Admission: RE | Admit: 2016-11-17 | Discharge: 2016-11-17 | Disposition: A | Discharge status: Home / Self Care | Payer: Self-pay | Source: Ambulatory Visit | Attending: Family Medicine | Admitting: Family Medicine

## 2016-11-17 ENCOUNTER — Ambulatory Visit (HOSPITAL_COMMUNITY): Payer: Self-pay

## 2016-11-17 ENCOUNTER — Telehealth: Payer: Self-pay

## 2016-11-17 DIAGNOSIS — N83202 Unspecified ovarian cyst, left side: Secondary | ICD-10-CM | POA: Insufficient documentation

## 2016-11-17 DIAGNOSIS — G8929 Other chronic pain: Secondary | ICD-10-CM

## 2016-11-17 DIAGNOSIS — R1031 Right lower quadrant pain: Secondary | ICD-10-CM

## 2016-11-17 NOTE — Telephone Encounter (Signed)
CMA call regarding lab results   Patient verify DOB  Patient was aware and understood  

## 2016-11-17 NOTE — Telephone Encounter (Signed)
-----   Message from Lizbeth BarkMandesia R Hairston, [place] sent at 11/17/2016  1:43 PM EDT ----- Labs that evaluated your blood cells, fluid and electrolyte balance are normal. No signs of anemia, infection, or inflammation present. Thyroid function normal. When thyroid is overfunctioning it can cause weight loss. Follow up with referral.

## 2016-11-18 ENCOUNTER — Ambulatory Visit: Payer: Self-pay | Admitting: Family Medicine

## 2016-11-23 ENCOUNTER — Ambulatory Visit (HOSPITAL_COMMUNITY): Payer: Self-pay

## 2016-11-23 ENCOUNTER — Telehealth: Payer: Self-pay

## 2016-11-23 ENCOUNTER — Other Ambulatory Visit: Payer: Self-pay | Admitting: Family Medicine

## 2016-11-23 DIAGNOSIS — N83202 Unspecified ovarian cyst, left side: Secondary | ICD-10-CM

## 2016-11-23 NOTE — Telephone Encounter (Signed)
Patient return call wanted more information about results  Patient was aware and understood

## 2016-11-23 NOTE — Telephone Encounter (Signed)
CMA call regarding lab results   Patient verify DOB  Patient was aware and understood  

## 2016-11-23 NOTE — Telephone Encounter (Signed)
-----   Message from Lizbeth BarkMandesia R Hairston, FNP sent at 11/23/2016  1:51 PM EDT ----- Left sided ovarian cyst 3.6 cm. You will be referred to gynecology.

## 2016-12-08 ENCOUNTER — Encounter: Payer: Self-pay | Admitting: Obstetrics & Gynecology

## 2017-01-01 ENCOUNTER — Encounter: Payer: Self-pay | Admitting: Obstetrics & Gynecology

## 2017-01-01 ENCOUNTER — Ambulatory Visit (INDEPENDENT_AMBULATORY_CARE_PROVIDER_SITE_OTHER): Payer: Self-pay | Admitting: Obstetrics & Gynecology

## 2017-01-01 DIAGNOSIS — N83209 Unspecified ovarian cyst, unspecified side: Secondary | ICD-10-CM | POA: Insufficient documentation

## 2017-01-01 DIAGNOSIS — N83202 Unspecified ovarian cyst, left side: Secondary | ICD-10-CM

## 2017-01-01 NOTE — Patient Instructions (Signed)
Ovarian Cyst An ovarian cyst is a fluid-filled sac that forms on an ovary. The ovaries are small organs that produce eggs in women. Various types of cysts can form on the ovaries. Some may cause symptoms and require treatment. Most ovarian cysts go away on their own, are not cancerous (are benign), and do not cause problems. Common types of ovarian cysts include:  Functional (follicle) cysts. ? Occur during the menstrual cycle, and usually go away with the next menstrual cycle if you do not get pregnant. ? Usually cause no symptoms.  Endometriomas. ? Are cysts that form from the tissue that lines the uterus (endometrium). ? Are sometimes called "chocolate cysts" because they become filled with blood that turns brown. ? Can cause pain in the lower abdomen during intercourse and during your period.  Cystadenoma cysts. ? Develop from cells on the outside surface of the ovary. ? Can get very large and cause lower abdomen pain and pain with intercourse. ? Can cause severe pain if they twist or break open (rupture).  Dermoid cysts. ? Are sometimes found in both ovaries. ? May contain different kinds of body tissue, such as skin, teeth, hair, or cartilage. ? Usually do not cause symptoms unless they get very big.  Theca lutein cysts. ? Occur when too much of a certain hormone (human chorionic gonadotropin) is produced and overstimulates the ovaries to produce an egg. ? Are most common after having procedures used to assist with the conception of a baby (in vitro fertilization).  What are the causes? Ovarian cysts may be caused by:  Ovarian hyperstimulation syndrome. This is a condition that can develop from taking fertility medicines. It causes multiple large ovarian cysts to form.  Polycystic ovarian syndrome (PCOS). This is a common hormonal disorder that can cause ovarian cysts, as well as problems with your period or fertility.  What increases the risk? The following factors may make  you more likely to develop ovarian cysts:  Being overweight or obese.  Taking fertility medicines.  Taking certain forms of hormonal birth control.  Smoking.  What are the signs or symptoms? Many ovarian cysts do not cause symptoms. If symptoms are present, they may include:  Pelvic pain or pressure.  Pain in the lower abdomen.  Pain during sex.  Abdominal swelling.  Abnormal menstrual periods.  Increasing pain with menstrual periods.  How is this diagnosed? These cysts are commonly found during a routine pelvic exam. You may have tests to find out more about the cyst, such as:  Ultrasound.  X-ray of the pelvis.  CT scan.  MRI.  Blood tests.  How is this treated? Many ovarian cysts go away on their own without treatment. Your health care provider may want to check your cyst regularly for 2-3 months to see if it changes. If you are in menopause, it is especially important to have your cyst monitored closely because menopausal women have a higher rate of ovarian cancer. When treatment is needed, it may include:  Medicines to help relieve pain.  A procedure to drain the cyst (aspiration).  Surgery to remove the whole cyst.  Hormone treatment or birth control pills. These methods are sometimes used to help dissolve a cyst.  Follow these instructions at home:  Take over-the-counter and prescription medicines only as told by your health care provider.  Do not drive or use heavy machinery while taking prescription pain medicine.  Get regular pelvic exams and Pap tests as often as told by your health care   provider.  Return to your normal activities as told by your health care provider. Ask your health care provider what activities are safe for you.  Do not use any products that contain nicotine or tobacco, such as cigarettes and e-cigarettes. If you need help quitting, ask your health care provider.  Keep all follow-up visits as told by your health care provider.  This is important. Contact a health care provider if:  Your periods are late, irregular, or painful, or they stop.  You have pelvic pain that does not go away.  You have pressure on your bladder or trouble emptying your bladder completely.  You have pain during sex.  You have any of the following in your abdomen: ? A feeling of fullness. ? Pressure. ? Discomfort. ? Pain that does not go away. ? Swelling.  You feel generally ill.  You become constipated.  You lose your appetite.  You develop severe acne.  You start to have more body hair and facial hair.  You are gaining weight or losing weight without changing your exercise and eating habits.  You think you may be pregnant. Get help right away if:  You have abdominal pain that is severe or gets worse.  You cannot eat or drink without vomiting.  You suddenly develop a fever.  Your menstrual period is much heavier than usual. This information is not intended to replace advice given to you by your health care provider. Make sure you discuss any questions you have with your health care provider. Document Released: 04/13/2005 Document Revised: 11/01/2015 Document Reviewed: 09/15/2015 Elsevier Interactive Patient Education  2018 Elsevier Inc.  

## 2017-01-01 NOTE — Progress Notes (Signed)
Obstetrics and Gynecology New Patient Evaluation  Appointment Date: 01/01/2017  OBGYN Clinic: [facility]  Primary Care Provider: Arrie Senate R  Referring Provider: Thomas Hoff*  Chief Complaint:  Chief Complaint  Patient presents with  . Ovarian Cyst  . Pelvic Pain    History of Present Illness: Alicia Cochran is a 26 y.o. Hispanic G2P1011 (Patient's last menstrual period was 12/13/2016.), seen for evaluation of a 3cm L ovarian cyst incidentally seen on CT scan in June and RLQ pain.  She reports RLQ pain of 78mo duration that she describes as constant aching pain that prevents her from sleeping at night. She states that she occasionally feels it in her back. She denies sharp stabbing pains. She states that nothing has made it better. It is not associated with her period. CT scan remarkable only for L ovarian cyst. She also reports increased urinary urgency and frequency, but denies any pain with urination. She also reports frequent constipation with associated bright red blood in her stool. She reports frequently feeling bloated after eating. She also reports a 10lb weight loss over past year.  She states her periods come every 2 weeks and she sometimes bleeds 2d or 7d. She was previously on OCPs but stopped due to breakthrough bleeding.   She denies breast s/s, fevers, chills, night sweats, chest pain, SOB, nausea, vomiting, dysuria, hematuria, vaginal itching, vaginal discharge, vaginal bleeding, dyspareunia, diarrhea.  Review of Systems:  Her 12 point review of systems is negative or as noted in the History of Present Illness.  There are no active problems to display for this patient.   The following portions of the patient's history were reviewed and updated as appropriate: allergies, current medications, past family history, past medical history, past social history, past surgical history and problem list.  Past Medical History:  Past  Medical History:  Diagnosis Date  . Cholestasis of pregnancy in third trimester   . Hx of chlamydia infection     Past Surgical History:  History reviewed. No pertinent surgical history.  Past Obstetrical History:  OB History  Gravida Para Term Preterm AB Living  0 1 1  SAB TAB Ectopic Multiple Live Births  0 1 0 0 1    # Outcome Date GA Lbr Len/2nd Weight Sex Delivery Anes PTL Lv  2 Term 12/10/14 [redacted]w[redacted]d 11:09 / 01:15 6 lb 11 oz (3.033 kg) M Vag-Spont EPI  LIV     Birth Comments: caput  1 TAB 2009              Past Gynecological History: As per HPI. Menarche age 18 Periods: regular History of Pap Smear(s): Yes.   Last pap June 2018, which was negative History of STI(s): No. She is currently using Condoms for contraception.   Social History:  Social History   Social History  . Marital status: Single    Spouse name: N/A  . Number of children: N/A  . Years of education: N/A   Occupational History  . Not on file.   Social History Main Topics  . Smoking status: Never Smoker  . Smokeless tobacco: Never Used  . Alcohol use No  . Drug use: No  . Sexual activity: Yes   Other Topics Concern  . Not on file   Social History Narrative   ** Merged History Encounter **        Family History:  Family History  Problem Relation Age of Onset  . Asthma Mother   .  Diabetes Maternal Grandmother   . Alcohol abuse Neg Hx   . Arthritis Neg Hx   . Birth defects Neg Hx   . Cancer Neg Hx   . COPD Neg Hx   . Depression Neg Hx   . Drug abuse Neg Hx   . Early death Neg Hx   . Hearing loss Neg Hx   . Heart disease Neg Hx   . Hyperlipidemia Neg Hx   . Hypertension Neg Hx   . Kidney disease Neg Hx   . Learning disabilities Neg Hx   . Mental illness Neg Hx   . Mental retardation Neg Hx   . Miscarriages / Stillbirths Neg Hx   . Stroke Neg Hx   . Vision loss Neg Hx   . Varicose Veins Neg Hx    Medications Ms. Perez-Sanchez had no medications administered during  this visit. Current Outpatient Prescriptions  Medication Sig Dispense Refill  . docusate sodium (COLACE) 100 MG capsule Take 1 capsule (100 mg total) by mouth daily as needed for mild constipation. (Patient not taking: Reported on 11/10/2016) 30 capsule 1  . hydrocortisone (ANUSOL-HC) 2.5 % rectal cream Place 1 application rectally 2 (two) times daily. For 7 days. (Patient not taking: Reported on 11/10/2016) 30 g 0  . ibuprofen (ADVIL,MOTRIN) 800 MG tablet Take 1 tablet (800 mg total) by mouth every 8 (eight) hours as needed for moderate pain or cramping (Take with food). (Patient not taking: Reported on 11/10/2016) 30 tablet 0  . levonorgestrel-ethinyl estradiol (AVIANE,ALESSE,LESSINA) 0.1-20 MG-MCG tablet Take 1 tablet by mouth daily. (Patient not taking: Reported on 11/10/2016) 1 Package 11  . metroNIDAZOLE (FLAGYL) 500 MG tablet Take 1 tablet (500 mg total) by mouth 2 (two) times daily. (Patient not taking: Reported on 11/10/2016) 14 tablet 0  . nitrofurantoin, macrocrystal-monohydrate, (MACROBID) 100 MG capsule Take 1 capsule (100 mg total) by mouth 2 (two) times daily. (Patient not taking: Reported on 11/10/2016) 10 capsule 0  . Vitamin D, Ergocalciferol, (DRISDOL) 50000 units CAPS capsule Take 1 capsule (50,000 Units total) by mouth every 7 (seven) days. (Patient not taking: Reported on 11/10/2016) 16 capsule 0   No current facility-administered medications for this visit.     Allergies Patient has no known allergies.  Physical Exam:  BP 105/63   Pulse 84   Ht  (1.499 m)   Wt 106 lb (48.1 kg)   LMP 12/13/2016   BMI 21.41 kg/m  Body mass index is 21.41 kg/m. Weight last year: 115 per peatient General appearance: Well nourished, well developed female in no acute distress.  Cardiovascular: normal rate noted Respiratory:  Normal work of breathing Abdomen: No masses, hernias; diffusely non tender to palpation, non distended, no CVA tenderness Neuro/Psych:  Normal mood and affect.   Skin:  Warm and dry.  CLINICAL DATA:  Right lower quadrant pain for 3 months  EXAM: CT ABDOMEN AND PELVIS WITHOUT CONTRAST  TECHNIQUE: Multidetector CT imaging of the abdomen and pelvis was performed following the standard protocol without IV contrast.  COMPARISON:  None.  FINDINGS: Lower chest: Lung bases are clear. No effusions. Heart is normal size.  Hepatobiliary: No focal hepatic abnormality. Gallbladder not visualized, question contracted versus prior cholecystectomy.  Pancreas: No focal abnormality or ductal dilatation.  Spleen: No focal abnormality.  Normal size.  Adrenals/Urinary Tract: No adrenal abnormality. No focal renal abnormality. No stones or hydronephrosis. Urinary bladder is unremarkable.  Stomach/Bowel: Normal appendix. Stomach, large and small bowel grossly unremarkable.  Vascular/Lymphatic: No evidence  of aneurysm or adenopathy.  Reproductive: Uterus and right adnexa unremarkable. 3.6 cm cyst in the left ovary.  Other: No free fluid or free air.  Musculoskeletal: No acute bony abnormality.  IMPRESSION: 3.6 cm cyst in the left ovary, likely functional cyst.  Normal appendix.  No renal or ureteral stones.  No hydronephrosis.   Electronically Signed   By: Charlett NoseKevin  Dover M.D.   On: 11/18/2016 08:07 Assessment/ Plan:    1. L ovarian cyst: Likely functional ovarian cyst given size, appearance and lack of red flag symptoms. - Repeat pelvic US  - Follow up prn  2. RLQ pain: Unclear etiology, unlikely gyn related given imaging results, lack of red flag symptoms, associated GI complaints. Acute etiology unlikely given recent CT scan, negative UA/UCx, physical exam, persistence of symptoms. Patient's pain was not reproducible on physical exam.  - Consider GI referral for further workup - Pt advised to follow up with HD for coordination of care  3. Contraception  - Pt desires IUD, instructed to f/u with HD for placement given  insurance status  RTC prn No orders of the defined types were placed in this encounter.  Dannette Barbararew Sueko Dimichele, Medical Student

## 2017-01-05 ENCOUNTER — Ambulatory Visit (HOSPITAL_COMMUNITY): Admission: RE | Admit: 2017-01-05 | Payer: Self-pay | Source: Ambulatory Visit

## 2017-02-23 ENCOUNTER — Ambulatory Visit: Payer: Self-pay | Attending: Internal Medicine

## 2017-03-24 ENCOUNTER — Encounter: Payer: Self-pay | Admitting: Family Medicine

## 2017-03-24 ENCOUNTER — Ambulatory Visit: Payer: Self-pay | Attending: Family Medicine | Admitting: Family Medicine

## 2017-03-24 VITALS — BP 91/51 | HR 85 | Temp 98.7°F | Resp 18 | Ht 59.0 in | Wt 107.0 lb

## 2017-03-24 DIAGNOSIS — K0381 Cracked tooth: Secondary | ICD-10-CM | POA: Insufficient documentation

## 2017-03-24 DIAGNOSIS — K0889 Other specified disorders of teeth and supporting structures: Secondary | ICD-10-CM | POA: Insufficient documentation

## 2017-03-24 MED ORDER — IBUPROFEN 600 MG PO TABS: 600.0000 mg | ORAL_TABLET | Freq: Three times a day (TID) | ORAL | 0 refills | Status: DC | PRN

## 2017-03-24 MED ORDER — BENZOCAINE 10 % MT GEL: 1.0000 "application " | OROMUCOSAL | 0 refills | Status: DC | PRN

## 2017-03-24 MED ORDER — SENSODYNE PRONAMEL DT PSTE: PASTE | DENTAL | Status: DC

## 2017-03-24 NOTE — Patient Instructions (Signed)
Dental Caries Dental caries are spots of decay (cavities) in teeth. They are in the outer layer of your tooth (enamel). Treat them as soon as you can. If they are not treated, they can spread decay and lead to painful infection. Follow these instructions at home: General instructions  Take good care of your mouth and teeth. This keeps them healthy. ? Brush your teeth 2 times a day. Use toothpaste with fluoride in it. ? Floss your teeth once a day.  If your dentist prescribed an antibiotic medicine to treat an infection, take it as told. Do not stop taking the antibiotic even if your condition gets better.  Keep all follow-up visits as told by your dentist. This is important. This includes all cleanings. Preventing dental caries  Brush your teeth every morning and night. Use fluoride toothpaste.  Get regular dental cleanings.  If you are at risk of dental caries. ? Wash your mouth with prescription mouthwash (chlorhexidine). ? Put topical fluoride on your teeth.  Drink water with fluoride in it.  Drink water instead of sugary drinks.  Eat healthy meals and snacks. Contact a doctor if:  You have symptoms of tooth decay. Summary  Dental caries are spots of decay (cavities) in teeth. They are in the outer layer of your tooth.  Take an antibiotic to treat an infection, if told by your dentist. Do not stop taking the antibiotic even if your condition gets better.  Regular dental cleanings and brushing can help prevent dental caries. This information is not intended to replace advice given to you by your health care provider. Make sure you discuss any questions you have with your health care provider. Document Released: 01/21/2008 Document Revised: 12/29/2015 Document Reviewed: 12/29/2015 Elsevier Interactive Patient Education  2017 Elsevier Inc.  

## 2017-03-24 NOTE — Progress Notes (Signed)
Subjective:  Patient ID: Alicia Cochran, female    DOB: 01/18/1991  Age: 26 y.o. MRN: [id number]  CC: Establish Care (Patient would like a dentist referrals. )   HPI Alicia Quamathalie Cochran presents for dental referral. Dental pain for several weeks to left lower molar. Associated symptoms include tooth and gum sensitivity. She denies any facial swelling, tongue swelling, or drainage. Reports history of dental braces placed 2 weeks ago. She reports dental problem prior to placement and braces were not applied to molar. She reports it was recommend she f/u with her primary dentist. She requests referral.   Outpatient Medications Prior to Visit  Medication Sig Dispense Refill  . docusate sodium (COLACE) 100 MG capsule Take 1 capsule (100 mg total) by mouth daily as needed for mild constipation. (Patient not taking: Reported on 11/10/2016) 30 capsule 1  . hydrocortisone (ANUSOL-HC) 2.5 % rectal cream Place 1 application rectally 2 (two) times daily. For 7 days. (Patient not taking: Reported on 11/10/2016) 30 g 0  . levonorgestrel-ethinyl estradiol (AVIANE,ALESSE,LESSINA) 0.1-20 MG-MCG tablet Take 1 tablet by mouth daily. (Patient not taking: Reported on 11/10/2016) 1 Package 11  . metroNIDAZOLE (FLAGYL) 500 MG tablet Take 1 tablet (500 mg total) by mouth 2 (two) times daily. (Patient not taking: Reported on 11/10/2016) 14 tablet 0  . nitrofurantoin, macrocrystal-monohydrate, (MACROBID) 100 MG capsule Take 1 capsule (100 mg total) by mouth 2 (two) times daily. (Patient not taking: Reported on 11/10/2016) 10 capsule 0  . Vitamin D, Ergocalciferol, (DRISDOL) 50000 units CAPS capsule Take 1 capsule (50,000 Units total) by mouth every 7 (seven) days. (Patient not taking: Reported on 11/10/2016) 16 capsule 0  . ibuprofen (ADVIL,MOTRIN) 800 MG tablet Take 1 tablet (800 mg total) by mouth every 8 (eight) hours as needed for moderate pain or cramping (Take with food). (Patient not taking: Reported on  11/10/2016) 30 tablet 0   No facility-administered medications prior to visit.     ROS Review of Systems  Constitutional: Negative.   HENT: Positive for dental problem.   Respiratory: Negative.   Cardiovascular: Negative.         Objective:  BP (!) 91/51 (BP Location: Left Arm, Patient Position: Sitting, Cuff Size: Normal)   Pulse 85   Temp 98.7 F (37.1 C) (Oral)   Resp 18   Ht 4\' 11"  (1.499 m)   Wt 107 lb (48.5 kg)   SpO2 97%   BMI 21.61 kg/m   BP/Weight 03/24/2017 01/01/2017 11/10/2016  Systolic BP 91 105 93  Diastolic BP 51 63 54  Wt. (Lbs) 107 106 107.6  BMI 21.61 21.41 21.73     Physical Exam  Constitutional: She appears well-developed and well-nourished.  HENT:  Head: Normocephalic and atraumatic.  Right Ear: External ear normal.  Left Ear: External ear normal.  Nose: Nose normal.  Mouth/Throat: Oropharynx is clear and moist. Abnormal dentition.    Cardiovascular: Normal rate, regular rhythm, normal heart sounds and intact distal pulses.  Pulmonary/Chest: Effort normal and breath sounds normal.  Abdominal: Soft. Bowel sounds are normal.  Nursing note and vitals reviewed.    Assessment & Plan:   1. Pain, dental  - Ambulatory referral to Dentistry - benzocaine (ORAJEL) 10 % mucosal gel; Use as directed 1 application in the mouth or throat as needed for mouth pain.  Dispense: 5.3 g; Refill: 0 - Dentifrices (SENSODYNE PRONAMEL) PSTE; Use as directed. - ibuprofen (ADVIL,MOTRIN) 600 MG tablet; Take 1 tablet (600 mg total) by mouth every 8 (eight) hours as  needed.  Dispense: 30 tablet; Refill: 0  2. Cracked tooth  - Ambulatory referral to Dentistry      Follow-up: Return if symptoms worsen or fail to improve.   Lizbeth BarkMandesia R Inari Shin FNP

## 2017-06-03 ENCOUNTER — Other Ambulatory Visit: Payer: Self-pay

## 2017-06-03 ENCOUNTER — Ambulatory Visit: Payer: Self-pay | Attending: Family Medicine | Admitting: *Deleted

## 2017-06-03 ENCOUNTER — Ambulatory Visit: Payer: Self-pay | Admitting: Family Medicine

## 2017-06-03 ENCOUNTER — Ambulatory Visit: Payer: Self-pay | Admitting: Licensed Clinical Social Worker

## 2017-06-03 VITALS — BP 102/65 | HR 56 | Temp 98.7°F | Resp 16 | Wt 105.6 lb

## 2017-06-03 DIAGNOSIS — R001 Bradycardia, unspecified: Secondary | ICD-10-CM | POA: Insufficient documentation

## 2017-06-03 DIAGNOSIS — R1031 Right lower quadrant pain: Secondary | ICD-10-CM

## 2017-06-03 DIAGNOSIS — F411 Generalized anxiety disorder: Secondary | ICD-10-CM | POA: Insufficient documentation

## 2017-06-03 DIAGNOSIS — F419 Anxiety disorder, unspecified: Secondary | ICD-10-CM
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
## 2017-06-03 NOTE — Addendum Note (Signed)
Addended by: Guy FrancoBENJAMIN, Paxtyn Wisdom on: 06/03/2017 05:36 PM   Modules accepted: Level of Service

## 2017-06-04 ENCOUNTER — Ambulatory Visit: Payer: Self-pay | Attending: Family Medicine | Admitting: Family Medicine

## 2017-06-04 ENCOUNTER — Encounter: Payer: Self-pay | Admitting: Family Medicine

## 2017-06-04 VITALS — BP 98/66 | HR 64 | Temp 98.1°F | Ht 59.0 in | Wt 106.8 lb

## 2017-06-04 DIAGNOSIS — R140 Abdominal distension (gaseous): Secondary | ICD-10-CM | POA: Insufficient documentation

## 2017-06-04 DIAGNOSIS — K5900 Constipation, unspecified: Secondary | ICD-10-CM | POA: Insufficient documentation

## 2017-06-04 DIAGNOSIS — Z760 Encounter for issue of repeat prescription: Secondary | ICD-10-CM | POA: Insufficient documentation

## 2017-06-04 DIAGNOSIS — F411 Generalized anxiety disorder: Secondary | ICD-10-CM | POA: Insufficient documentation

## 2017-06-04 DIAGNOSIS — Z87898 Personal history of other specified conditions: Secondary | ICD-10-CM | POA: Insufficient documentation

## 2017-06-04 DIAGNOSIS — R1031 Right lower quadrant pain: Secondary | ICD-10-CM | POA: Insufficient documentation

## 2017-06-04 DIAGNOSIS — Z79899 Other long term (current) drug therapy: Secondary | ICD-10-CM | POA: Insufficient documentation

## 2017-06-04 DIAGNOSIS — Z8742 Personal history of other diseases of the female genital tract: Secondary | ICD-10-CM | POA: Insufficient documentation

## 2017-06-04 MED ORDER — IBUPROFEN 600 MG PO TABS: 600.0000 mg | ORAL_TABLET | Freq: Three times a day (TID) | ORAL | 0 refills | Status: DC | PRN

## 2017-06-04 MED ORDER — SENSODYNE PRONAMEL DT PSTE: PASTE | DENTAL | Status: DC

## 2017-06-04 MED ORDER — CITALOPRAM HYDROBROMIDE 20 MG PO TABS: 20.0000 mg | ORAL_TABLET | Freq: Every day | ORAL | 2 refills | Status: DC

## 2017-06-04 NOTE — Patient Instructions (Signed)
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
## 2017-06-10 ENCOUNTER — Ambulatory Visit (HOSPITAL_COMMUNITY)
Admission: RE | Admit: 2017-06-10 | Discharge: 2017-06-10 | Disposition: A | Discharge status: Home / Self Care | Payer: Self-pay | Source: Ambulatory Visit | Attending: Family Medicine | Admitting: Family Medicine

## 2017-06-10 DIAGNOSIS — R1031 Right lower quadrant pain: Secondary | ICD-10-CM | POA: Insufficient documentation

## 2017-06-10 DIAGNOSIS — Z8742 Personal history of other diseases of the female genital tract: Secondary | ICD-10-CM | POA: Insufficient documentation

## 2017-06-10 MED ORDER — IOPAMIDOL (ISOVUE-300) INJECTION 61%
INTRAVENOUS | Status: AC
  Filled 2017-06-10: qty 100

## 2017-06-10 MED ORDER — IOPAMIDOL (ISOVUE-300) INJECTION 61%
100.0000 mL | Freq: Once | INTRAVENOUS | Status: AC | PRN
  Administered 2017-06-10: 100 mL via INTRAVENOUS

## 2017-06-17 ENCOUNTER — Telehealth: Payer: Self-pay

## 2017-06-17 NOTE — Telephone Encounter (Signed)
Patient was called and informed of lab results. Patient was given the information for gyn referral.

## 2017-06-21 ENCOUNTER — Telehealth: Payer: Self-pay | Admitting: Family Medicine

## 2017-06-21 NOTE — Telephone Encounter (Signed)
2 page, Paperwork received 06/21/17.

## 2017-07-30 ENCOUNTER — Ambulatory Visit (INDEPENDENT_AMBULATORY_CARE_PROVIDER_SITE_OTHER): Payer: Self-pay | Admitting: Obstetrics & Gynecology

## 2017-07-30 ENCOUNTER — Encounter: Payer: Self-pay | Admitting: Obstetrics & Gynecology

## 2017-07-30 ENCOUNTER — Telehealth: Payer: Self-pay | Admitting: *Deleted

## 2017-07-30 DIAGNOSIS — Z113 Encounter for screening for infections with a predominantly sexual mode of transmission: Secondary | ICD-10-CM

## 2017-07-30 DIAGNOSIS — R102 Pelvic and perineal pain: Secondary | ICD-10-CM

## 2017-07-30 LAB — POCT URINALYSIS DIP (DEVICE)
BILIRUBIN URINE: NEGATIVE
GLUCOSE, UA: NEGATIVE mg/dL
Hgb urine dipstick: NEGATIVE
KETONES UR: NEGATIVE mg/dL
LEUKOCYTES UA: NEGATIVE
Nitrite: NEGATIVE
PROTEIN: NEGATIVE mg/dL
SPECIFIC GRAVITY, URINE: 1.015 (ref 1.005–1.030)
Urobilinogen, UA: 0.2 mg/dL (ref 0.0–1.0)
pH: 7 (ref 5.0–8.0)

## 2017-07-30 NOTE — Telephone Encounter (Signed)
See notes added to office notes for todays visit. -referral to [facility] has to come from PCP on orange card- patient notified.

## 2017-07-30 NOTE — Progress Notes (Signed)
Addendum: 4:37 Referral to [facility] initiated- called and left message for coordinator who handles patients with orange card. I called [facility] back and they state referral has to come from provider on her orange card which is usually her pcp.  I called Kenyon Anaathalie and notified her that she must call provider on her orange card and ask them to [facility].. She states that is [facility]- Elgie CollardMandesia Harrison and she will call them and ask for referral- she also states they had started a workup.

## 2017-07-30 NOTE — Patient Instructions (Signed)
Pelvic Pain, Female Pelvic pain is pain in your lower abdomen, below your belly button and between your hips. The pain may start suddenly (acute), keep coming back (recurring), or last a long time (chronic). Pelvic pain that lasts longer than six months is considered chronic. Pelvic pain may affect your:  Reproductive organs.  Urinary system.  Digestive tract.  Musculoskeletal system.  There are many potential causes of pelvic pain. Sometimes, the pain can be a result of digestive or urinary conditions, strained muscles or ligaments, or even reproductive conditions. Sometimes the cause of pelvic pain is not known. Follow these instructions at home:  Take over-the-counter and prescription medicines only as told by your health care provider.  Rest as told by your health care provider.  Do not have sex it if hurts.  Keep a journal of your pelvic pain. Write down: ? When the pain started. ? Where the pain is located. ? What seems to make the pain better or worse, such as food or your menstrual cycle. ? Any symptoms you have along with the pain.  Keep all follow-up visits as told by your health care provider. This is important. Contact a health care provider if:  Medicine does not help your pain.  Your pain comes back.  You have new symptoms.  You have abnormal vaginal discharge or bleeding, including bleeding after menopause.  You have a fever or chills.  You are constipated.  You have blood in your urine or stool.  You have foul-smelling urine.  You feel weak or lightheaded. Get help right away if:  You have sudden severe pain.  Your pain gets steadily worse.  You have severe pain along with fever, nausea, vomiting, or excessive sweating.  You lose consciousness. This information is not intended to replace advice given to you by your health care provider. Make sure you discuss any questions you have with your health care provider. Document Released: 03/10/2004  Document Revised: 05/08/2015 Document Reviewed: 02/01/2015 Elsevier Interactive Patient Education  2018 Elsevier Inc.  

## 2017-07-30 NOTE — Progress Notes (Signed)
Patient ID: Alicia Cochran, female   DOB: 1990/04/29, 27 y.o.   MRN: [id number]  Chief Complaint  Patient presents with  . Gynecologic Exam  f/u pelvic pain and imaging result  HPI Alicia Cochran is a 27 y.o. female.  [id number] Patient's last menstrual period was 07/15/2017 (exact date). Since 09/2016 increasing frequency of s/p and RLQ pain and dyspareunia. Imaging has shown R and left functional cysts. HPI  Past Medical History:  Diagnosis Date  . Cholestasis of pregnancy in third trimester   . Hx of chlamydia infection     No past surgical history on file.  Family History  Problem Relation Age of Onset  . Asthma Mother   . Diabetes Maternal Grandmother   . Alcohol abuse Neg Hx   . Arthritis Neg Hx   . Birth defects Neg Hx   . Cancer Neg Hx   . COPD Neg Hx   . Depression Neg Hx   . Drug abuse Neg Hx   . Early death Neg Hx   . Hearing loss Neg Hx   . Heart disease Neg Hx   . Hyperlipidemia Neg Hx   . Hypertension Neg Hx   . Kidney disease Neg Hx   . Learning disabilities Neg Hx   . Mental illness Neg Hx   . Mental retardation Neg Hx   . Miscarriages / Stillbirths Neg Hx   . Stroke Neg Hx   . Vision loss Neg Hx   . Varicose Veins Neg Hx     Social History Social History   Tobacco Use  . Smoking status: Never Smoker  . Smokeless tobacco: Never Used  Substance Use Topics  . Alcohol use: No    Alcohol/week: 0.0 oz  . Drug use: No    No Known Allergies  Current Outpatient Medications  Medication Sig Dispense Refill  . Dentifrices (SENSODYNE PRONAMEL) PSTE Use as directed.     No current facility-administered medications for this visit.     Review of Systems Review of Systems  Constitutional: Negative.   Gastrointestinal: Positive for abdominal pain. Negative for constipation.  Genitourinary: Positive for dyspareunia, dysuria, frequency, pelvic pain and urgency. Negative for vaginal bleeding and vaginal discharge.    Blood pressure  105/71, pulse (!) 59, height 4\' 11"  (1.499 m), weight 105 lb 1.6 oz (47.7 kg), last menstrual period 07/15/2017.  Physical Exam Physical Exam  Constitutional: She appears well-developed.  Cardiovascular: Normal rate.  Pulmonary/Chest: Effort normal.  Abdominal: Soft. She exhibits no mass. There is no tenderness.  Genitourinary: Vagina normal. No vaginal discharge found.  Genitourinary Comments: IUD string 2 cm at os  Vitals reviewed.   Data Reviewed  CLINICAL DATA:  Right lower quadrant abdominal pain.  EXAM: CT ABDOMEN AND PELVIS WITH CONTRAST  TECHNIQUE: Multidetector CT imaging of the abdomen and pelvis was performed using the standard protocol following bolus administration of intravenous contrast.  CONTRAST:  ISOVUE-300 IOPAMIDOL (ISOVUE-300) INJECTION 61%  COMPARISON:  Abdominal CT 11/17/2016  FINDINGS: Lower chest: No acute abnormality.  Hepatobiliary: No focal liver abnormality is seen. No gallstones, gallbladder wall thickening, or biliary dilatation.  Pancreas: Unremarkable. No pancreatic ductal dilatation or surrounding inflammatory changes.  Spleen: Normal in size without focal abnormality.  Adrenals/Urinary Tract: Adrenal glands are unremarkable. Kidneys are normal, without renal calculi, focal lesion, or hydronephrosis. Bladder is unremarkable.  Stomach/Bowel: Stomach is within normal limits. Appendix appears normal. No evidence of bowel wall thickening, distention, or inflammatory changes.  Vascular/Lymphatic: No significant vascular findings are present.  No enlarged abdominal or pelvic lymph nodes.  Reproductive: Uterus and bilateral adnexa demonstrate no acute abnormalities. IUD in the uterus. 2.1 cm probable corpus luteal cyst on the right ovary.  Other: No abdominal wall hernia or abnormality. No abdominopelvic ascites.  Musculoskeletal: No acute or significant osseous findings.  IMPRESSION: No evidence of acute  abnormalities within the abdomen or pelvis.  2.1 cm probable corpus luteal cyst on the right ovary.   Electronically Signed   By: Ted Mcalpineobrinka  Dimitrova M.D.   On: 06/10/2017 10:58  Assessment    Pelvic pain with dyspareunia and urinary symptoms Functional ovarian cysts intermittent     Plan    Urine culture and STD screen Urology evaluation for possible IC RTC 3 months       Scheryl DarterJames Naythan Douthit 07/30/2017, 12:07 PM

## 2017-08-03 LAB — GC/CHLAMYDIA PROBE AMP (~~LOC~~) NOT AT ARMC
CHLAMYDIA, DNA PROBE: NEGATIVE
Neisseria Gonorrhea: NEGATIVE

## 2017-08-05 ENCOUNTER — Other Ambulatory Visit: Payer: Self-pay

## 2017-08-05 ENCOUNTER — Ambulatory Visit: Payer: Self-pay | Attending: Internal Medicine | Admitting: Physician Assistant

## 2017-08-05 VITALS — BP 98/64 | HR 71 | Temp 98.4°F | Resp 18 | Ht 59.0 in | Wt 105.8 lb

## 2017-08-05 DIAGNOSIS — R102 Pelvic and perineal pain: Secondary | ICD-10-CM | POA: Insufficient documentation

## 2017-08-05 DIAGNOSIS — R350 Frequency of micturition: Secondary | ICD-10-CM | POA: Insufficient documentation

## 2017-08-05 DIAGNOSIS — N941 Unspecified dyspareunia: Secondary | ICD-10-CM | POA: Insufficient documentation

## 2017-08-05 DIAGNOSIS — G4700 Insomnia, unspecified: Secondary | ICD-10-CM | POA: Insufficient documentation

## 2017-08-05 NOTE — Patient Instructions (Signed)
Melatonin 3-6mg  about 45 mins before bedtime  Insomnia Insomnia is a sleep disorder that makes it difficult to fall asleep or to stay asleep. Insomnia can cause tiredness (fatigue), low energy, difficulty concentrating, mood swings, and poor performance at work or school. There are three different ways to classify insomnia:  Difficulty falling asleep.  Difficulty staying asleep.  Waking up too early in the morning.  Any type of insomnia can be long-term (chronic) or short-term (acute). Both are common. Short-term insomnia usually lasts for three months or less. Chronic insomnia occurs at least three times a week for longer than three months. What are the causes? Insomnia may be caused by another condition, situation, or substance, such as:  Anxiety.  Certain medicines.  Gastroesophageal reflux disease (GERD) or other gastrointestinal conditions.  Asthma or other breathing conditions.  Restless legs syndrome, sleep apnea, or other sleep disorders.  Chronic pain.  Menopause. This may include hot flashes.  Stroke.  Abuse of alcohol, tobacco, or illegal drugs.  Depression.  Caffeine.  Neurological disorders, such as Alzheimer disease.  An overactive thyroid (hyperthyroidism).  The cause of insomnia may not be known. What increases the risk? Risk factors for insomnia include:  Gender. Women are more commonly affected than men.  Age. Insomnia is more common as you get older.  Stress. This may involve your professional or personal life.  Income. Insomnia is more common in people with lower income.  Lack of exercise.  Irregular work schedule or night shifts.  Traveling between different time zones.  What are the signs or symptoms? If you have insomnia, trouble falling asleep or trouble staying asleep is the main symptom. This may lead to other symptoms, such as:  Feeling fatigued.  Feeling nervous about going to sleep.  Not feeling rested in the  morning.  Having trouble concentrating.  Feeling irritable, anxious, or depressed.  How is this treated? Treatment for insomnia depends on the cause. If your insomnia is caused by an underlying condition, treatment will focus on addressing the condition. Treatment may also include:  Medicines to help you sleep.  Counseling or therapy.  Lifestyle adjustments.  Follow these instructions at home:  Take medicines only as directed by your health care provider.  Keep regular sleeping and waking hours. Avoid naps.  Keep a sleep diary to help you and your health care provider figure out what could be causing your insomnia. Include: ? When you sleep. ? When you wake up during the night. ? How well you sleep. ? How rested you feel the next day. ? Any side effects of medicines you are taking. ? What you eat and drink.  Make your bedroom a comfortable place where it is easy to fall asleep: ? Put up shades or special blackout curtains to block light from outside. ? Use a white noise machine to block noise. ? Keep the temperature cool.  Exercise regularly as directed by your health care provider. Avoid exercising right before bedtime.  Use relaxation techniques to manage stress. Ask your health care provider to suggest some techniques that may work well for you. These may include: ? Breathing exercises. ? Routines to release muscle tension. ? Visualizing peaceful scenes.  Cut back on alcohol, caffeinated beverages, and cigarettes, especially close to bedtime. These can disrupt your sleep.  Do not overeat or eat spicy foods right before bedtime. This can lead to digestive discomfort that can make it hard for you to sleep.  Limit screen use before bedtime. This includes: ?  Watching TV. ? Using your smartphone, tablet, and computer.  Stick to a routine. This can help you fall asleep faster. Try to do a quiet activity, brush your teeth, and go to bed at the same time each night.  Get  out of bed if you are still awake after 15 minutes of trying to sleep. Keep the lights down, but try reading or doing a quiet activity. When you feel sleepy, go back to bed.  Make sure that you drive carefully. Avoid driving if you feel very sleepy.  Keep all follow-up appointments as directed by your health care provider. This is important. Contact a health care provider if:  You are tired throughout the day or have trouble in your daily routine due to sleepiness.  You continue to have sleep problems or your sleep problems get worse. Get help right away if:  You have serious thoughts about hurting yourself or someone else. This information is not intended to replace advice given to you by your health care provider. Make sure you discuss any questions you have with your health care provider. Document Released: 04/10/2000 Document Revised: 09/13/2015 Document Reviewed: 01/12/2014 Elsevier Interactive Patient Education  [employer].

## 2017-08-05 NOTE — Progress Notes (Signed)
Patient ID: Alicia Cochran, female   DOB: 30-Sep-1990, 27 y.o.   MRN: [id number] .      Alicia Cochran, is a 27 y.o. female  [id number]  [id number]  DOB - 30-Sep-1990  Subjective:  Chief Complaint and HPI: Alicia Cochran is a 27 y.o. female here today for insomnia and urology referral.  Dyspareunia, pelvic pain, and urinary frequency with neg work-up at gyn and they want her to see urology.  She has been having this problem more than 1 year.    Also trouble going to sleep and staying asleep at times.  Feels as though she can't stop her mind from thinking at bedtime.    ROS:   Constitutional:  No f/c, No night sweats, No unexplained weight loss. EENT:  No vision changes, No blurry vision, No hearing changes. No mouth, throat, or ear problems.  Respiratory: No cough, No SOB Cardiac: No CP, no palpitations GI:  No abd pain, No N/V/D. GU: No new Urinary s/sx Musculoskeletal: No joint pain Neuro: No headache, no dizziness, no motor weakness.  Skin: No rash Endocrine:  No polydipsia. No polyuria.  Psych: Denies SI/HI  No problems updated.  ALLERGIES: No Known Allergies  PAST MEDICAL HISTORY: Past Medical History:  Diagnosis Date  . Cholestasis of pregnancy in third trimester   . Hx of chlamydia infection     MEDICATIONS AT HOME: Prior to Admission medications   Medication Sig Start Date End Date Taking? Authorizing Provider  Dentifrices (SENSODYNE PRONAMEL) PSTE Use as directed. Patient not taking: Reported on 08/05/2017 06/04/17   Lizbeth BarkHairston, Mandesia R, FNP     Objective:  EXAM:   Vitals:   08/05/17 1151  BP: 98/64  Pulse: 71  Resp: 18  Temp: 98.4 F (36.9 C)  TempSrc: Oral  SpO2: 96%  Weight: 105 lb 12.8 oz (48 kg)  Height: 4\' 11"  (1.499 m)    General appearance : A&OX3. NAD. Non-toxic-appearing HEENT: Atraumatic and Normocephalic.  PERRLA. EOM intact.  TM clear B. Mouth-MMM, post pharynx WNL w/o erythema, No PND. Neck: supple,  no JVD. No cervical lymphadenopathy. No thyromegaly Chest/Lungs:  Breathing-non-labored, Good air entry bilaterally, breath sounds normal without rales, rhonchi, or wheezing  CVS: S1 S2 regular, no murmurs, gallops, rubs  Abdomen: Bowel sounds present, Non tender and not distended with no gaurding, rigidity or rebound. Extremities: Bilateral Lower Ext shows no edema, both legs are warm to touch with = pulse throughout Neurology:  CN II-XII grossly intact, Non focal.   Psych:  TP linear. J/I WNL. Normal speech. Appropriate eye contact and affect.  Skin:  No Rash  Data Review No results found for: HGBA1C   Assessment & Plan   1. Insomnia, unspecified type Melatonin 3-6mg  at bedtime.  Sleep hygiene reviewed.  2. Dyspareunia in female Refer to urology  3. Urinary frequency - Ambulatory referral to Urology  Patient have been counseled extensively about nutrition and exercise  Return in about 6 months (around 02/04/2018) for assign new PCP; insomnia.  The patient was given clear instructions to go to ER or return to medical center if symptoms don't improve, worsen or new problems develop. The patient verbalized understanding. The patient was told to call to get lab results if they haven't heard anything in the next week.     Georgian CoAngela Meaghen Vecchiarelli, PA-C [facility] [place], [place] [phone]   08/05/2017, 11:55 AM

## 2017-08-05 NOTE — Progress Notes (Signed)
Need a urology Referral

## 2017-09-13 ENCOUNTER — Ambulatory Visit: Payer: Self-pay | Admitting: Internal Medicine

## 2017-09-22 ENCOUNTER — Ambulatory Visit: Payer: Self-pay | Attending: Family Medicine

## 2017-10-18 ENCOUNTER — Encounter: Payer: Self-pay | Admitting: Internal Medicine

## 2017-10-18 ENCOUNTER — Ambulatory Visit: Payer: Self-pay | Attending: Internal Medicine | Admitting: Internal Medicine

## 2017-10-18 VITALS — BP 107/64 | HR 60 | Temp 98.7°F | Resp 16 | Ht 59.0 in | Wt 106.6 lb

## 2017-10-18 DIAGNOSIS — N941 Unspecified dyspareunia: Secondary | ICD-10-CM | POA: Insufficient documentation

## 2017-10-18 DIAGNOSIS — R102 Pelvic and perineal pain: Secondary | ICD-10-CM | POA: Insufficient documentation

## 2017-10-18 DIAGNOSIS — Z833 Family history of diabetes mellitus: Secondary | ICD-10-CM | POA: Insufficient documentation

## 2017-10-18 DIAGNOSIS — F411 Generalized anxiety disorder: Secondary | ICD-10-CM | POA: Insufficient documentation

## 2017-10-18 NOTE — Progress Notes (Signed)
Patient ID: Alicia Cochran, female    DOB: [birth year]  MRN: [id number]  CC: re-establish   Subjective: Alicia Cochran is a 27 y.o. female who presents to establish with me as PCP Her concerns today include:  Patient with history of dyspareunia and pelvic pain  Patient without any complaints or concerns today.  She states that she was told she had to come in to establish with her new PCP.  Last seen 07/2017 by PA for urology referral for dyspareunia and pelvic pain.  Work-up by gynecologist was negative.  It was recommended that she be referred to urology.  She has an appointment already schedule to see the urologist. Patient Active Problem List   Diagnosis Date Noted  . Pelvic pain in female 07/30/2017  . GAD (generalized anxiety disorder) 06/04/2017  . History of ovarian cyst 06/04/2017  . Ovarian cyst 01/01/2017     Current Outpatient Medications on File Prior to Visit  Medication Sig Dispense Refill  . Dentifrices (SENSODYNE PRONAMEL) PSTE Use as directed. (Patient not taking: Reported on 08/05/2017)     No current facility-administered medications on file prior to visit.     No Known Allergies  Social History   Socioeconomic History  . Marital status: Single    Spouse name: Not on file  . Number of children: Not on file  . Years of education: Not on file  . Highest education level: Not on file  Occupational History  . Not on file  Social Needs  . Financial resource strain: Not on file  . Food insecurity:    Worry: Not on file    Inability: Not on file  . Transportation needs:    Medical: Not on file    Non-medical: Not on file  Tobacco Use  . Smoking status: Never Smoker  . Smokeless tobacco: Never Used  Substance and Sexual Activity  . Alcohol use: No    Alcohol/week: 0.0 oz  . Drug use: No  . Sexual activity: Yes  Lifestyle  . Physical activity:    Days per week: Not on file    Minutes per session: Not on file  . Stress: Not on file    Relationships  . Social connections:    Talks on phone: Not on file    Gets together: Not on file    Attends religious service: Not on file    Active member of club or organization: Not on file    Attends meetings of clubs or organizations: Not on file    Relationship status: Not on file  . Intimate partner violence:    Fear of current or ex partner: Not on file    Emotionally abused: Not on file    Physically abused: Not on file    Forced sexual activity: Not on file  Other Topics Concern  . Not on file  Social History Narrative   ** Merged History Encounter **        Family History  Problem Relation Age of Onset  . Asthma Mother   . Diabetes Maternal Grandmother   . Alcohol abuse Neg Hx   . Arthritis Neg Hx   . Birth defects Neg Hx   . Cancer Neg Hx   . COPD Neg Hx   . Depression Neg Hx   . Drug abuse Neg Hx   . Early death Neg Hx   . Hearing loss Neg Hx   . Heart disease Neg Hx   . Hyperlipidemia Neg Hx   .  Hypertension Neg Hx   . Kidney disease Neg Hx   . Learning disabilities Neg Hx   . Mental illness Neg Hx   . Mental retardation Neg Hx   . Miscarriages / Stillbirths Neg Hx   . Stroke Neg Hx   . Vision loss Neg Hx   . Varicose Veins Neg Hx     No past surgical history on file.  ROS: Review of Systems Negative except as stated above PHYSICAL EXAM: BP 107/64   Pulse 60   Temp 98.7 F (37.1 C) (Oral)   Resp 16   Ht 4\' 11"  (1.499 m)   Wt 106 lb 9.6 oz (48.4 kg)   SpO2 97%   BMI 21.53 kg/m   Physical Exam  General appearance - alert, well appearing, young female and in no distress Mental status - normal mood, behavior, speech, dress, motor activity, and thought processes  ASSESSMENT AND PLAN: 1. Dyspareunia in female Keep urology appt F/u with me PRN   Patient was given the opportunity to ask questions.  Patient verbalized understanding of the plan and was able to repeat key elements of the plan.   No orders of the defined types were  placed in this encounter.    Requested Prescriptions    No prescriptions requested or ordered in this encounter    No follow-ups on file.  Jonah Blue, MD, FACP

## 2017-11-12 ENCOUNTER — Ambulatory Visit (INDEPENDENT_AMBULATORY_CARE_PROVIDER_SITE_OTHER): Payer: Self-pay | Admitting: Urology

## 2017-11-12 DIAGNOSIS — R3982 Chronic bladder pain: Secondary | ICD-10-CM

## 2017-11-12 DIAGNOSIS — R3915 Urgency of urination: Secondary | ICD-10-CM

## 2017-11-12 DIAGNOSIS — R350 Frequency of micturition: Secondary | ICD-10-CM

## 2017-11-12 DIAGNOSIS — R351 Nocturia: Secondary | ICD-10-CM

## 2017-12-31 ENCOUNTER — Encounter: Payer: Self-pay | Admitting: *Deleted

## 2018-01-07 ENCOUNTER — Encounter: Payer: Self-pay | Admitting: Internal Medicine

## 2018-01-28 ENCOUNTER — Other Ambulatory Visit (HOSPITAL_COMMUNITY)
Admission: RE | Admit: 2018-01-28 | Discharge: 2018-01-28 | Disposition: A | Discharge status: Home / Self Care | Payer: Self-pay | Source: Other Acute Inpatient Hospital | Attending: Urology | Admitting: Urology

## 2018-01-28 ENCOUNTER — Ambulatory Visit (INDEPENDENT_AMBULATORY_CARE_PROVIDER_SITE_OTHER): Payer: Self-pay | Admitting: Urology

## 2018-01-28 DIAGNOSIS — R3982 Chronic bladder pain: Secondary | ICD-10-CM

## 2018-01-28 DIAGNOSIS — R350 Frequency of micturition: Secondary | ICD-10-CM | POA: Insufficient documentation

## 2018-01-28 DIAGNOSIS — R351 Nocturia: Secondary | ICD-10-CM

## 2018-01-28 LAB — URINALYSIS, COMPLETE (UACMP) WITH MICROSCOPIC
Bilirubin Urine: NEGATIVE
GLUCOSE, UA: NEGATIVE mg/dL
HGB URINE DIPSTICK: NEGATIVE
Ketones, ur: NEGATIVE mg/dL
Nitrite: NEGATIVE
Protein, ur: NEGATIVE mg/dL
SPECIFIC GRAVITY, URINE: 1.011 (ref 1.005–1.030)
pH: 7 (ref 5.0–8.0)

## 2018-01-29 LAB — URINE CULTURE: Culture: <10000 — AB

## 2018-04-06 ENCOUNTER — Ambulatory Visit: Payer: Self-pay

## 2018-04-07 ENCOUNTER — Ambulatory Visit: Payer: Self-pay | Attending: Internal Medicine

## 2018-04-08 ENCOUNTER — Encounter: Payer: Self-pay | Admitting: Internal Medicine

## 2018-04-08 ENCOUNTER — Ambulatory Visit: Payer: Self-pay | Attending: Internal Medicine | Admitting: Internal Medicine

## 2018-04-08 ENCOUNTER — Ambulatory Visit: Payer: Self-pay | Admitting: Internal Medicine

## 2018-04-08 VITALS — BP 115/72 | HR 67 | Temp 98.5°F | Resp 16 | Wt 108.4 lb

## 2018-04-08 DIAGNOSIS — Z975 Presence of (intrauterine) contraceptive device: Secondary | ICD-10-CM | POA: Insufficient documentation

## 2018-04-08 DIAGNOSIS — Z124 Encounter for screening for malignant neoplasm of cervix: Secondary | ICD-10-CM

## 2018-04-08 DIAGNOSIS — R300 Dysuria: Secondary | ICD-10-CM | POA: Insufficient documentation

## 2018-04-08 DIAGNOSIS — N898 Other specified noninflammatory disorders of vagina: Secondary | ICD-10-CM | POA: Insufficient documentation

## 2018-04-08 LAB — POCT URINALYSIS DIP (CLINITEK)
BILIRUBIN UA: NEGATIVE
BILIRUBIN UA: NEGATIVE mg/dL
Blood, UA: NEGATIVE
GLUCOSE UA: NEGATIVE mg/dL
NITRITE UA: NEGATIVE
PH UA: 6.5 (ref 5.0–8.0)
POC PROTEIN,UA: NEGATIVE
Spec Grav, UA: 1.015 (ref 1.010–1.025)
Urobilinogen, UA: 0.2 E.U./dL

## 2018-04-08 MED ORDER — FLUCONAZOLE 150 MG PO TABS: 150.0000 mg | ORAL_TABLET | Freq: Once | ORAL | 0 refills | Status: DC

## 2018-04-08 MED ORDER — METRONIDAZOLE 500 MG PO TABS: 500.0000 mg | ORAL_TABLET | Freq: Two times a day (BID) | ORAL | 0 refills | Status: DC

## 2018-04-08 NOTE — Progress Notes (Signed)
Patient ID: Alicia Cochran, female    DOB: 01/07/1991  MRN: [id number]  CC: Vaginal Itching and Vaginal Discharge   Subjective: Alicia Cochran is a 27 y.o. female who presents for UC visit Her concerns today include:   Patient complains of itching and vaginal irritation times several days.  Endorses white to clear vaginal discharge.  No dysuria.  Menses are regular.  LNMP 12/3-12/2017. Heavy 3rd and 4th days.   Pt is G1P1 Hx of abn paps.  Pap in 2015 revealed atypical squamous cells of undetermined significance with positive HPV.  Repeat Pap in September 2016 revealed LSIL.  She was seeing a gynecologist at the time.  She denies having any procedures done because of the abnormal Pap. Sex active with 1 partner.  Has copper IUD.    Patient Active Problem List   Diagnosis Date Noted  . Pelvic pain in female 07/30/2017  . GAD (generalized anxiety disorder) 06/04/2017  . History of ovarian cyst 06/04/2017  . Ovarian cyst 01/01/2017     Current Outpatient Medications on File Prior to Visit  Medication Sig Dispense Refill  . Dentifrices (SENSODYNE PRONAMEL) PSTE Use as directed. (Patient not taking: Reported on 08/05/2017)     No current facility-administered medications on file prior to visit.     No Known Allergies  Social History   Socioeconomic History  . Marital status: Single    Spouse name: Not on file  . Number of children: Not on file  . Years of education: Not on file  . Highest education level: Not on file  Occupational History  . Not on file  Social Needs  . Financial resource strain: Not on file  . Food insecurity:    Worry: Not on file    Inability: Not on file  . Transportation needs:    Medical: Not on file    Non-medical: Not on file  Tobacco Use  . Smoking status: Never Smoker  . Smokeless tobacco: Never Used  Substance and Sexual Activity  . Alcohol use: No    Alcohol/week: 0.0 standard drinks  . Drug use: No  . Sexual activity:  Yes  Lifestyle  . Physical activity:    Days per week: Not on file    Minutes per session: Not on file  . Stress: Not on file  Relationships  . Social connections:    Talks on phone: Not on file    Gets together: Not on file    Attends religious service: Not on file    Active member of club or organization: Not on file    Attends meetings of clubs or organizations: Not on file    Relationship status: Not on file  . Intimate partner violence:    Fear of current or ex partner: Not on file    Emotionally abused: Not on file    Physically abused: Not on file    Forced sexual activity: Not on file  Other Topics Concern  . Not on file  Social History Narrative   ** Merged History Encounter **        Family History  Problem Relation Age of Onset  . Asthma Mother   . Diabetes Maternal Grandmother   . Alcohol abuse Neg Hx   . Arthritis Neg Hx   . Birth defects Neg Hx   . Cancer Neg Hx   . COPD Neg Hx   . Depression Neg Hx   . Drug abuse Neg Hx   . Early death Neg  Hx   . Hearing loss Neg Hx   . Heart disease Neg Hx   . Hyperlipidemia Neg Hx   . Hypertension Neg Hx   . Kidney disease Neg Hx   . Learning disabilities Neg Hx   . Mental illness Neg Hx   . Mental retardation Neg Hx   . Miscarriages / Stillbirths Neg Hx   . Stroke Neg Hx   . Vision loss Neg Hx   . Varicose Veins Neg Hx     No past surgical history on file.  ROS: Review of Systems Negative except as above. PHYSICAL EXAM: BP 115/72   Pulse 67   Temp 98.5 F (36.9 C) (Oral)   Resp 16   Wt 108 lb 6.4 oz (49.2 kg)   SpO2 100%   BMI 21.89 kg/m   Physical Exam  General appearance - alert, well appearing, and in no distress Mental status - normal mood, behavior, speech, dress, motor activity, and thought processes Pelvic - Staff Erie Noe present -normal external genitalia, vulva, vagina, cervix, uterus and adnexa.  Small amount of thin white dischg around cervix.  String from IUD noted outside  of the  cervical oz  Results for orders placed or performed in visit on 04/08/18  POCT URINALYSIS DIP (CLINITEK)  Result Value Ref Range   Color, UA yellow yellow   Clarity, UA clear clear   Glucose, UA negative negative mg/dL   Bilirubin, UA negative negative   Ketones, POC UA negative negative mg/dL   Spec Grav, UA 1.610 9.604 - 1.025   Blood, UA negative negative   pH, UA 6.5 5.0 - 8.0   POC PROTEIN,UA negative negative, trace   Urobilinogen, UA 0.2 0.2 or 1.0 E.U./dL   Nitrite, UA Negative Negative   Leukocytes, UA Small (1+) (A) Negative    ASSESSMENT AND PLAN: 1. Vaginal irritation Sample of vaginal secretions sent.  Since this is a Friday afternoon and given her symptoms of itching, will treat empirically with Diflucan.  I have also sent a prescription for Flagyl for possible BV but I have told patient not to fill this until she hears back from me with lab results - fluconazole (DIFLUCAN) 150 MG tablet; Take 1 tablet (150 mg total) by mouth once for 1 dose.  Dispense: 1 tablet; Refill: 0 - metroNIDAZOLE (FLAGYL) 500 MG tablet; Take 1 tablet (500 mg total) by mouth 2 (two) times daily.  Dispense: 14 tablet; Refill: 0  2. Dysuria No UTI - POCT URINALYSIS DIP (CLINITEK)  3. Pap smear for cervical cancer screening - Cytology - PAP   Patient was given the opportunity to ask questions.  Patient verbalized understanding of the plan and was able to repeat key elements of the plan.   Orders Placed This Encounter  Procedures  . POCT URINALYSIS DIP (CLINITEK)     Requested Prescriptions   Signed Prescriptions Disp Refills  . fluconazole (DIFLUCAN) 150 MG tablet 1 tablet 0    Sig: Take 1 tablet (150 mg total) by mouth once for 1 dose.  . metroNIDAZOLE (FLAGYL) 500 MG tablet 14 tablet 0    Sig: Take 1 tablet (500 mg total) by mouth 2 (two) times daily.    No follow-ups on file.  Jonah Blue, MD, FACP

## 2018-04-12 ENCOUNTER — Other Ambulatory Visit: Payer: Self-pay | Admitting: Internal Medicine

## 2018-04-12 ENCOUNTER — Encounter: Payer: Self-pay | Admitting: Internal Medicine

## 2018-04-12 ENCOUNTER — Telehealth: Payer: Self-pay | Admitting: Internal Medicine

## 2018-04-12 DIAGNOSIS — N898 Other specified noninflammatory disorders of vagina: Secondary | ICD-10-CM

## 2018-04-12 LAB — CERVICOVAGINAL ANCILLARY ONLY
BACTERIAL VAGINITIS: POSITIVE — AB
CANDIDA VAGINITIS: POSITIVE — AB
CHLAMYDIA, DNA PROBE: NEGATIVE
Neisseria Gonorrhea: NEGATIVE
TRICH (WINDOWPATH): NEGATIVE

## 2018-04-12 LAB — CYTOLOGY - PAP: Diagnosis: NEGATIVE

## 2018-04-12 NOTE — Telephone Encounter (Signed)
1) Medication(s) Requested (by name): ° °2) Pharmacy of Choice: ° °3) Special Requests: ° ° °Approved medications will be sent to the pharmacy, we will reach out if there is an issue. ° °Requests made after 3pm may not be addressed until the following business day! ° °If a patient is unsure of the name of the medication(s) please note and ask patient to call back when they are able to provide all info, do not send to responsible party until all information is available! ° °

## 2018-04-28 ENCOUNTER — Ambulatory Visit: Payer: Self-pay | Admitting: Internal Medicine

## 2018-09-30 ENCOUNTER — Ambulatory Visit: Payer: Self-pay | Attending: Internal Medicine | Admitting: Internal Medicine

## 2018-09-30 ENCOUNTER — Other Ambulatory Visit: Payer: Self-pay

## 2018-09-30 ENCOUNTER — Encounter: Payer: Self-pay | Admitting: Internal Medicine

## 2018-09-30 ENCOUNTER — Ambulatory Visit (INDEPENDENT_AMBULATORY_CARE_PROVIDER_SITE_OTHER): Payer: Self-pay | Admitting: Urology

## 2018-09-30 VITALS — BP 117/74 | HR 77 | Temp 98.4°F | Resp 16 | Wt 114.2 lb

## 2018-09-30 DIAGNOSIS — R3982 Chronic bladder pain: Secondary | ICD-10-CM

## 2018-09-30 DIAGNOSIS — N898 Other specified noninflammatory disorders of vagina: Secondary | ICD-10-CM

## 2018-09-30 DIAGNOSIS — R351 Nocturia: Secondary | ICD-10-CM

## 2018-09-30 DIAGNOSIS — R300 Dysuria: Secondary | ICD-10-CM

## 2018-09-30 LAB — POCT URINALYSIS DIP (CLINITEK)
Bilirubin, UA: NEGATIVE
Glucose, UA: NEGATIVE mg/dL
Ketones, POC UA: NEGATIVE mg/dL
Leukocytes, UA: NEGATIVE
Nitrite, UA: NEGATIVE
POC PROTEIN,UA: NEGATIVE
Spec Grav, UA: <=1.005 — AB (ref 1.010–1.025)
Urobilinogen, UA: 0.2 E.U./dL
pH, UA: 6.5 (ref 5.0–8.0)

## 2018-09-30 MED ORDER — FLUCONAZOLE 150 MG PO TABS: ORAL_TABLET | ORAL | 0 refills | Status: DC

## 2018-09-30 NOTE — Progress Notes (Signed)
Patient ID: Alicia Cochran, female    DOB: [birth year]  MRN: [id number]  CC:  Vaginal itching  Subjective:  Alicia Cochran is a 28 y.o. female who presents for UC visit. Her concerns today include:   "I think I have another yeast infection."  Having vaginal discgh, itching and vaginal irritation x 3 days.  Dischg is thick and white. Sexually active with same partner.  Used Monastat overnight treatment  -also having a litle burning with urination over same time period.  No fever or LBP.  LNMP was 3 wks ago.    Patient Active Problem List   Diagnosis Date Noted  . Pelvic pain in female 07/30/2017  . GAD (generalized anxiety disorder) 06/04/2017  . History of ovarian cyst 06/04/2017  . Ovarian cyst 01/01/2017     Current Outpatient Medications on File Prior to Visit  Medication Sig Dispense Refill  . Dentifrices (SENSODYNE PRONAMEL) PSTE Use as directed. (Patient not taking: Reported on 08/05/2017)    . fluconazole (DIFLUCAN) 150 MG tablet TAKE 1 TABLET(150 MG) BY MOUTH 1 TIME FOR 1 DOSE (Patient not taking: Reported on 09/30/2018) 1 tablet 0  . metroNIDAZOLE (FLAGYL) 500 MG tablet Take 1 tablet (500 mg total) by mouth 2 (two) times daily. (Patient not taking: Reported on 09/30/2018) 14 tablet 0   No current facility-administered medications on file prior to visit.     No Known Allergies   ROS: Review of Systems Negative except as above  PHYSICAL EXAM: BP 117/74   Pulse 77   Temp 98.4 F (36.9 C) (Oral)   Resp 16   Wt 114 lb 3.2 oz (51.8 kg)   SpO2 97%   BMI 23.07 kg/m   Physical Exam  General appearance - alert, well appearing, young female and in no distress Mental status - normal mood, behavior, speech, dress, motor activity, and thought processes  Results for orders placed or performed in visit on 09/30/18  POCT URINALYSIS DIP (CLINITEK)  Result Value Ref Range   Color, UA yellow yellow   Clarity, UA cloudy (A) clear   Glucose, UA negative negative  mg/dL   Bilirubin, UA negative negative   Ketones, POC UA negative negative mg/dL   Spec Grav, UA <=0.177 (A) 1.010 - 1.025   Blood, UA moderate (A) negative   pH, UA 6.5 5.0 - 8.0   POC PROTEIN,UA negative negative, trace   Urobilinogen, UA 0.2 0.2 or 1.0 E.U./dL   Nitrite, UA Negative Negative   Leukocytes, UA Negative Negative    ASSESSMENT AND PLAN: 1. Vaginal irritation  Urine cytology sent to check for yeast and STD screen.  Patient declined HIV screening.  We will treat empirically for yeast with Diflucan. - POCT URINALYSIS DIP (CLINITEK) - Urine cytology ancillary only - fluconazole (DIFLUCAN) 150 MG tablet; TAKE 1 TABLET(150 MG) BY MOUTH 1 TIME FOR 1 DOSE  Dispense: 1 tablet; Refill: 0  2. Dysuria UA negative for UTI.  However she does have blood on urine dip and is currently not on her menstrual cycle so we will send for urine analysis - POCT URINALYSIS DIP (CLINITEK)   Patient was given the opportunity to ask questions.  Patient verbalized understanding of the plan and was able to repeat key elements of the plan.   No orders of the defined types were placed in this encounter.    Requested Prescriptions    No prescriptions requested or ordered in this encounter    Future Appointments  Date Time Provider Department Center  09/30/2018  9:10 AM Marcine MatarJohnson, Deborah B, MD [id number] None    Jonah Blueeborah Johnson, MD, Jerrel IvoryFACP

## 2018-10-03 ENCOUNTER — Other Ambulatory Visit: Payer: Self-pay | Admitting: Internal Medicine

## 2018-10-03 ENCOUNTER — Other Ambulatory Visit: Payer: Self-pay | Admitting: Urology

## 2018-10-03 DIAGNOSIS — N898 Other specified noninflammatory disorders of vagina: Secondary | ICD-10-CM

## 2018-10-03 LAB — URINE CYTOLOGY ANCILLARY ONLY
Bacterial vaginitis: POSITIVE — AB
Candida vaginitis: NEGATIVE
Chlamydia: NEGATIVE
Neisseria Gonorrhea: NEGATIVE
Trichomonas: NEGATIVE

## 2018-10-03 MED ORDER — METRONIDAZOLE 500 MG PO TABS: 500.0000 mg | ORAL_TABLET | Freq: Two times a day (BID) | ORAL | 0 refills | Status: DC

## 2018-10-04 ENCOUNTER — Encounter (HOSPITAL_COMMUNITY)
Admission: RE | Admit: 2018-10-04 | Discharge: 2018-10-04 | Disposition: A | Discharge status: Home / Self Care | Payer: Self-pay | Source: Ambulatory Visit | Attending: Urology | Admitting: Urology

## 2018-10-04 ENCOUNTER — Other Ambulatory Visit: Payer: Self-pay

## 2018-10-04 ENCOUNTER — Encounter (HOSPITAL_COMMUNITY): Payer: Self-pay

## 2018-10-04 ENCOUNTER — Other Ambulatory Visit (HOSPITAL_COMMUNITY)
Admission: RE | Admit: 2018-10-04 | Discharge: 2018-10-04 | Disposition: A | Discharge status: Home / Self Care | Payer: HRSA Program | Source: Ambulatory Visit | Attending: Urology | Admitting: Urology

## 2018-10-04 DIAGNOSIS — Z01812 Encounter for preprocedural laboratory examination: Secondary | ICD-10-CM | POA: Diagnosis present

## 2018-10-04 DIAGNOSIS — Z1159 Encounter for screening for other viral diseases: Secondary | ICD-10-CM | POA: Insufficient documentation

## 2018-10-05 LAB — NOVEL CORONAVIRUS, NAA (HOSP ORDER, SEND-OUT TO REF LAB; TAT 18-24 HRS): SARS-CoV-2, NAA: NOT DETECTED

## 2018-10-06 ENCOUNTER — Telehealth: Payer: Self-pay | Admitting: Internal Medicine

## 2018-10-06 NOTE — Telephone Encounter (Signed)
Pt was call ed to be remind that he need to bring the application and the paperwork that she need to apply for CAFa and OC, the dateline is today °

## 2018-10-06 NOTE — OR Nursing (Signed)
Recalled  [facility] about need of order for medication to instill in the bladder per obtain consent request.

## 2018-10-06 NOTE — OR Nursing (Signed)
Office called to ask about order/ dose  for the marcaine and pyridium. Dr. Jeffie Pollock nurse to call  When order is corrected.

## 2018-10-07 ENCOUNTER — Encounter (HOSPITAL_COMMUNITY)
Admission: RE | Disposition: A | Discharge status: Home / Self Care | Payer: Self-pay | Source: Home / Self Care | Attending: Urology

## 2018-10-07 ENCOUNTER — Ambulatory Visit (HOSPITAL_COMMUNITY): Payer: Self-pay | Admitting: Anesthesiology

## 2018-10-07 ENCOUNTER — Encounter (HOSPITAL_COMMUNITY): Payer: Self-pay

## 2018-10-07 ENCOUNTER — Ambulatory Visit (HOSPITAL_COMMUNITY)
Admission: RE | Admit: 2018-10-07 | Discharge: 2018-10-07 | Disposition: A | Discharge status: Home / Self Care | Payer: Self-pay | Attending: Urology | Admitting: Urology

## 2018-10-07 ENCOUNTER — Other Ambulatory Visit: Payer: Self-pay

## 2018-10-07 DIAGNOSIS — Z79899 Other long term (current) drug therapy: Secondary | ICD-10-CM | POA: Insufficient documentation

## 2018-10-07 DIAGNOSIS — F419 Anxiety disorder, unspecified: Secondary | ICD-10-CM | POA: Insufficient documentation

## 2018-10-07 DIAGNOSIS — N3010 Interstitial cystitis (chronic) without hematuria: Secondary | ICD-10-CM

## 2018-10-07 HISTORY — PX: CYSTO WITH HYDRODISTENSION: SHX5453

## 2018-10-07 LAB — PREGNANCY, URINE: Preg Test, Ur: NEGATIVE

## 2018-10-07 SURGERY — CYSTOSCOPY, WITH BLADDER HYDRODISTENSION
Anesthesia: General

## 2018-10-07 MED ORDER — BELLADONNA ALKALOIDS-OPIUM 16.2-60 MG RE SUPP
1.0000 | Freq: Every day | RECTAL | Status: DC
  Administered 2018-10-07: 1 via RECTAL
  Filled 2018-10-07: qty 1

## 2018-10-07 MED ORDER — LACTATED RINGERS IV SOLN
INTRAVENOUS | Status: DC | PRN
  Administered 2018-10-07: 12:00:00 via INTRAVENOUS

## 2018-10-07 MED ORDER — CEFAZOLIN SODIUM-DEXTROSE 2-4 GM/100ML-% IV SOLN
INTRAVENOUS | Status: AC
  Filled 2018-10-07: qty 100

## 2018-10-07 MED ORDER — PHENAZOPYRIDINE HCL 200 MG PO TABS
ORAL | Status: DC | PRN
  Administered 2018-10-07: 15 mL via INTRAVESICAL

## 2018-10-07 MED ORDER — ACETAMINOPHEN 650 MG RE SUPP: 650.0000 mg | RECTAL | Status: DC | PRN

## 2018-10-07 MED ORDER — DEXAMETHASONE SODIUM PHOSPHATE 10 MG/ML IJ SOLN
INTRAMUSCULAR | Status: AC
  Filled 2018-10-07: qty 1

## 2018-10-07 MED ORDER — MORPHINE SULFATE (PF) 2 MG/ML IV SOLN: 2.0000 mg | INTRAVENOUS | Status: DC | PRN

## 2018-10-07 MED ORDER — PROPOFOL 10 MG/ML IV BOLUS
INTRAVENOUS | Status: DC | PRN
  Administered 2018-10-07: 30 mg via INTRAVENOUS
  Administered 2018-10-07: 140 mg via INTRAVENOUS

## 2018-10-07 MED ORDER — FENTANYL CITRATE (PF) 100 MCG/2ML IJ SOLN
INTRAMUSCULAR | Status: AC
  Filled 2018-10-07: qty 2

## 2018-10-07 MED ORDER — OXYCODONE HCL 5 MG PO TABS: 5.0000 mg | ORAL_TABLET | ORAL | Status: DC | PRN

## 2018-10-07 MED ORDER — HYDROMORPHONE HCL 1 MG/ML IJ SOLN
0.2500 mg | INTRAMUSCULAR | Status: DC | PRN
  Administered 2018-10-07: 0.5 mg via INTRAVENOUS
  Filled 2018-10-07: qty 0.5

## 2018-10-07 MED ORDER — ACETAMINOPHEN 325 MG PO TABS: 650.0000 mg | ORAL_TABLET | ORAL | Status: DC | PRN

## 2018-10-07 MED ORDER — CEFAZOLIN SODIUM-DEXTROSE 2-4 GM/100ML-% IV SOLN
2.0000 g | INTRAVENOUS | Status: AC
  Administered 2018-10-07: 12:00:00 2 g via INTRAVENOUS

## 2018-10-07 MED ORDER — HYDROCODONE-ACETAMINOPHEN 7.5-325 MG PO TABS
1.0000 | ORAL_TABLET | Freq: Once | ORAL | Status: AC | PRN
  Administered 2018-10-07: 1 via ORAL
  Filled 2018-10-07: qty 1

## 2018-10-07 MED ORDER — MIDAZOLAM HCL 2 MG/2ML IJ SOLN
INTRAMUSCULAR | Status: AC
  Filled 2018-10-07: qty 2

## 2018-10-07 MED ORDER — WATER FOR IRRIGATION, STERILE IR SOLN
Status: DC | PRN
  Administered 2018-10-07: 3000 mL via INTRAVESICAL

## 2018-10-07 MED ORDER — SODIUM CHLORIDE 0.9% FLUSH: 3.0000 mL | INTRAVENOUS | Status: DC | PRN

## 2018-10-07 MED ORDER — PHENAZOPYRIDINE HCL 200 MG PO TABS
Freq: Once | ORAL | Status: DC
  Filled 2018-10-07: qty 15

## 2018-10-07 MED ORDER — SODIUM CHLORIDE 0.9% FLUSH: 3.0000 mL | Freq: Two times a day (BID) | INTRAVENOUS | Status: DC

## 2018-10-07 MED ORDER — HYDROCODONE-ACETAMINOPHEN 5-325 MG PO TABS: 1.0000 | ORAL_TABLET | Freq: Four times a day (QID) | ORAL | 0 refills | Status: DC | PRN

## 2018-10-07 MED ORDER — PROMETHAZINE HCL 25 MG/ML IJ SOLN: 6.2500 mg | INTRAMUSCULAR | Status: DC | PRN

## 2018-10-07 MED ORDER — DEXAMETHASONE SODIUM PHOSPHATE 10 MG/ML IJ SOLN
INTRAMUSCULAR | Status: DC | PRN
  Administered 2018-10-07: 5 mg via INTRAVENOUS

## 2018-10-07 MED ORDER — LACTATED RINGERS IV SOLN: INTRAVENOUS | Status: DC

## 2018-10-07 MED ORDER — MIDAZOLAM HCL 2 MG/2ML IJ SOLN: 0.5000 mg | Freq: Once | INTRAMUSCULAR | Status: DC | PRN

## 2018-10-07 MED ORDER — MIDAZOLAM HCL 5 MG/5ML IJ SOLN
INTRAMUSCULAR | Status: DC | PRN
  Administered 2018-10-07: 2 mg via INTRAVENOUS

## 2018-10-07 MED ORDER — PHENAZOPYRIDINE HCL 200 MG PO TABS: 200.0000 mg | ORAL_TABLET | Freq: Three times a day (TID) | ORAL | 0 refills | Status: DC | PRN

## 2018-10-07 MED ORDER — FENTANYL CITRATE (PF) 100 MCG/2ML IJ SOLN
INTRAMUSCULAR | Status: DC | PRN
  Administered 2018-10-07: 100 ug via INTRAVENOUS

## 2018-10-07 MED ORDER — SODIUM CHLORIDE 0.9 % IV SOLN: 250.0000 mL | INTRAVENOUS | Status: DC | PRN

## 2018-10-07 SURGICAL SUPPLY — 18 items
BAG DRAIN URO TABLE W/ADPT NS (BAG) ×2 IMPLANT
BAG DRN 8 ADPR NS SKTRN CSTL (BAG) ×1
CATH ROBINSON RED A/P 16FR (CATHETERS) ×2 IMPLANT
CLOTH BEACON ORANGE TIMEOUT ST (SAFETY) ×3 IMPLANT
GLOVE BIOGEL PI IND STRL 7.0 (GLOVE) IMPLANT
GLOVE BIOGEL PI INDICATOR 7.0 (GLOVE) ×2
GLOVE ECLIPSE 6.5 STRL STRAW (GLOVE) ×1 IMPLANT
GLOVE SURG SS PI 8.0 STRL IVOR (GLOVE) ×2 IMPLANT
GOWN STRL REUS W/TWL LRG LVL3 (GOWN DISPOSABLE) ×1 IMPLANT
GOWN STRL REUS W/TWL XL LVL3 (GOWN DISPOSABLE) ×1 IMPLANT
KIT TURNOVER CYSTO (KITS) ×2 IMPLANT
MANIFOLD NEPTUNE II (INSTRUMENTS) ×2 IMPLANT
NS IRRIG 1000ML POUR BTL (IV SOLUTION) ×2 IMPLANT
PACK CYSTO (CUSTOM PROCEDURE TRAY) ×2 IMPLANT
PAD ARMBOARD 7.5X6 YLW CONV (MISCELLANEOUS) ×2 IMPLANT
SYRINGE IRR TOOMEY STRL 70CC (SYRINGE) ×2 IMPLANT
TOWEL OR 17X26 4PK STRL BLUE (TOWEL DISPOSABLE) ×2 IMPLANT
WATER STERILE IRR 3000ML UROMA (IV SOLUTION) ×3 IMPLANT

## 2018-10-07 NOTE — Transfer of Care (Signed)
Immediate Anesthesia Transfer of Care Note  Patient: Alicia Cochran  Procedure(s) Performed: CYSTOSCOPY/HYDRODISTENSION INSTILL MARCAINE AND PYRIDIUM (N/A )  Patient Location: PACU  Anesthesia Type:General  Level of Consciousness: drowsy and patient cooperative  Airway & Oxygen Therapy: Patient Spontanous Breathing and Patient connected to nasal cannula oxygen  Post-op Assessment: Report given to RN, Post -op Vital signs reviewed and stable and Patient moving all extremities  Post vital signs: Reviewed and stable  Last Vitals:  Vitals Value Taken Time  BP    Temp    Pulse    Resp    SpO2      Last Pain:  Vitals:   10/07/18 1136  TempSrc: Oral  PainSc: 0-No pain      Patients Stated Pain Goal: 6 (65/79/03 8333)  Complications: No apparent anesthesia complications

## 2018-10-07 NOTE — H&P (Signed)
CC/HPI: Frequency, Nocturia and Urgency     Alicia Cochran returns today in f/u. She ran out of the elavil and had increased pain. She also has noted that her urethral caliber has increased and she has some SUI. She is back on elavil 25mg  nightly which has helped with sleep but she continues to have pain in the right lower abdomen during the day. She still has to void q15-30 min and nocturia 5x. The pain is worse when she is on her period and she has bloating. She has increased pain with a full bladder that is reduced but not fully relieved by voiding and she feels she doesn't empty completely. She has had yeast infections since her last visit. She recently took diflucan.   GU Hx: Alicia Cochran is a 28 yo female who is sent in consultation by Dr. Debroah LoopArnold for frequency. This started after a delivery in 2016 but she started to have some pain in the right groin and is into the top of the leg. That started about a year ago. She has had a CT that showed an ovarian cyst on the left and she had a second CT that showed a right sided cyst. She was sent because her symptoms were not felt to be gyn related. She can't sleep because of the discomfort and the urgency and frequency. The pain is worse with a full bladder and she gets some relief with voiding but the symptoms recur within about 15 min. She can void very frequently as much has every 15 minutes. She has nocturia 5-6x. She has some hesitancy and has some stranguria. The stream is weak. She doesn't feel she empties completely. She has some intermittency. She has no dysuria or hematuria. She has had no GI complaints. She has right anterior suprapubic pain with intercourse. She is G1P1 with a vaginal delivery in 2016. She has had no pelvic surgery. She has had UTI's but not since she was pregnant. She has had no history of stones. She has not noticed any dietary irritants. It does get worse with hydration. The pain is worse when she is on her period. She gets some relief with  activity.    ALLERGIES: None   MEDICATIONS: Amitriptyline Hcl 25 mg tablet 2 tablet PO Q HS     GU PSH: None   NON-GU PSH: None   GU PMH: Nocturia - 11/12/2017 Suprapubic pain, The etiology of her pain and voiding complaints are unclear but are suggestive of IC/CPPS. I am giong to give her a trial of amitriptyline and suggested she use aleve regularly for the next couple of weeks. I reviewed the side effects of the meds and told her to call if 25mg  of amitriptyline is too strong. She will return in 1 month and if unimproved we may need to consider cystoscopy with HOD and possibly urodynamics. - 11/12/2017 Urinary Frequency - 11/12/2017 Urinary Urgency - 11/12/2017    NON-GU PMH: Anxiety    FAMILY HISTORY: None   SOCIAL HISTORY: Marital Status: Single Preferred Language: English; Ethnicity: ; Race: Other Race, Latino Current Smoking Status: Patient has never smoked.   Tobacco Use Assessment Completed: Used Tobacco in last 30 days? Has never drank.  Drinks 1 caffeinated drink per day. 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    REVIEW OF SYSTEMS:    GU Review Female:   Patient reports trouble starting your stream, frequent urination, leakage of urine, get up at night to urinate, have to strain to urinate, and hard to postpone urination. Patient denies  stream starts and stops, being pregnant, and burning /pain with urination.  Gastrointestinal (Upper):   Patient denies nausea, vomiting, and indigestion/ heartburn.  Gastrointestinal (Lower):   Patient denies diarrhea and constipation.  Constitutional:   Patient denies fever, night sweats, weight loss, and fatigue.  Skin:   Patient denies skin rash/ lesion and itching.  Eyes:   Patient denies blurred vision and double vision.  Ears/ Nose/ Throat:   Patient denies sore throat and sinus problems.  Hematologic/Lymphatic:   Patient denies swollen glands and easy bruising.  Cardiovascular:   Patient denies leg swelling and chest pains.   Respiratory:   Patient denies cough and shortness of breath.  Endocrine:   Patient denies excessive thirst.  Musculoskeletal:   Patient denies back pain and joint pain.  Neurological:   Patient denies headaches and dizziness.  Psychologic:   Patient denies depression and anxiety.   VITAL SIGNS:      09/30/2018 03:04 PM  BP 117/75 mmHg  Heart Rate 81 /min  Temperature 98.4 F / 36.8 C   MULTI-SYSTEM PHYSICAL EXAMINATION:    Constitutional: Well-nourished. No physical deformities. Normally developed. Good grooming.  Respiratory: Normal breath sounds. No labored breathing, no use of accessory muscles.   Cardiovascular: Regular rate and rhythm. No murmur, no gallop.      PAST DATA REVIEWED:  Source Of History:  Patient  Urine Test Review:   Urinalysis   PROCEDURES:          Urinalysis - 81003 Dipstick Dipstick Cont'd  Specimen: Voided Bilirubin: Neg  Color: Yellow Ketones: Neg  Appearance: Clear Blood: 3+  Specific Gravity: 1.010 Protein: Neg  pH: 6.0 Urobilinogen: 0.2  Glucose: Neg Nitrites: Neg    Leukocyte Esterase: Trace    Notes: pt reports starting her period today   ASSESSMENT:      ICD-10 Details  1 GU:   Nocturia - R35.1 She needs cystoscopy and HOD and I reviewed the risks of bleeding, infection, bladder injury, retention, increased pain, thrombotic events and anesthetic complications.   2   Suprapubic pain - R39.82    PLAN:           Document Letter(s):  Created for Patient: Clinical Summary

## 2018-10-07 NOTE — Anesthesia Procedure Notes (Signed)
Performed by: Wai Litt J, CRNA       

## 2018-10-07 NOTE — Anesthesia Procedure Notes (Signed)
Procedure Name: General with mask airway Date/Time: 10/07/2018 12:14 PM Performed by: Charmaine Downs, CRNA Pre-anesthesia Checklist: Patient identified, Emergency Drugs available, Suction available and Patient being monitored Patient Re-evaluated:Patient Re-evaluated prior to induction Preoxygenation: Pre-oxygenation with 100% oxygen Induction Type: IV induction Ventilation: Mask ventilation without difficulty and Oral airway inserted - appropriate to patient size Placement Confirmation: positive ETCO2 Dental Injury: Teeth and Oropharynx as per pre-operative assessment

## 2018-10-07 NOTE — Discharge Instructions (Signed)

## 2018-10-07 NOTE — Anesthesia Preprocedure Evaluation (Signed)
Anesthesia Evaluation  Patient identified by MRN, date of birth, ID band Patient awake    Reviewed: Allergy & Precautions, NPO status , Patient's Chart, lab work & pertinent test results  Airway Mallampati: I  TM Distance: >3 FB Neck ROM: Full    Dental no notable dental hx. (+) Teeth Intact Braces in place upper and lower:   Pulmonary neg pulmonary ROS,    Pulmonary exam normal breath sounds clear to auscultation       Cardiovascular Exercise Tolerance: Good negative cardio ROS Normal cardiovascular examI Rhythm:Regular Rate:Normal     Neuro/Psych Anxiety negative neurological ROS  negative psych ROS   GI/Hepatic negative GI ROS, Neg liver ROS,   Endo/Other  negative endocrine ROS  Renal/GU negative Renal ROSPt with BV on atbx and took one diflucan a week ago for yeast infection- here for cysto with infusion of med/local  negative genitourinary   Musculoskeletal negative musculoskeletal ROS (+)   Abdominal   Peds negative pediatric ROS (+)  Hematology negative hematology ROS (+)   Anesthesia Other Findings   Reproductive/Obstetrics negative OB ROS                             Anesthesia Physical Anesthesia Plan  ASA: II  Anesthesia Plan: General   Post-op Pain Management:    Induction: Intravenous  PONV Risk Score and Plan:   Airway Management Planned: LMA and Mask  Additional Equipment:   Intra-op Plan:   Post-operative Plan: Extubation in OR  Informed Consent: I have reviewed the patients History and Physical, chart, labs and discussed the procedure including the risks, benefits and alternatives for the proposed anesthesia with the patient or authorized representative who has indicated his/her understanding and acceptance.     Dental advisory given  Plan Discussed with: CRNA  Anesthesia Plan Comments: (Plan Full PPE use  Plan GA, mask vs LMA vs ETT as needed  )         Anesthesia Quick Evaluation

## 2018-10-07 NOTE — Op Note (Signed)
Procedure: Cystoscopy with hydrodistention of the bladder and instillation of Pyridium and Marcaine.  Preop diagnosis: Painful bladder rule out interstitial cystitis.  Postop diagnosis: Interstitial cystitis.  Surgeon: Dr. Irine Seal.  Anesthesia: General.  Specimen: None.  Drain: None.  EBL: Minimal.  Complications: None.  Indications: The patient is a 28 year old female with a history of pelvic and bladder pain with frequency and nocturia.  She is to undergo hydrodistention of the bladder to assess for interstitial cystitis.  Procedure: She was given 2 g of Ancef.  A general anesthetic was induced.  She was placed in lithotomy position.  Her perineum and genitalia were prepped with Betadine solution she was draped in usual sterile fashion.  Cystoscopy was performed using a 33 [place] scope with a 30 degree lens.  Examination revealed a normal urethra.  The bladder wall was smooth and pale without tumors, stones or inflammation.  There was a small urachal cyst on the dome.  Ureteral orifices were unremarkable effluxing clear urine.  Hydrodistention of the bladder was performed under 80 cm of water pressure to capacity.  The bladder was drained and her capacity was found to be 900 mL under anesthesia.  She had very extensive glomerulations involving the entire bladder wall upon decompression of the bladder.  Reinspection of the bladder after dilation revealed no mucosal tears or other injuries.  The bladder was drained and the cystoscope was removed.  A 14 French red rubber catheter was inserted and the bladder was instilled with 15 mL of 0.5% Marcaine with 400 mg of crushed Pyridium.  The catheter was then removed.  She was taken down from lithotomy position, her anesthetic was reversed and she was moved recovery in stable condition.  There were no complications.

## 2018-10-07 NOTE — Anesthesia Postprocedure Evaluation (Signed)
Anesthesia Post Note  Patient: Alicia Cochran  Procedure(s) Performed: CYSTOSCOPY/HYDRODISTENSION INSTILL MARCAINE AND PYRIDIUM (N/A )  Patient location during evaluation: PACU Anesthesia Type: General Level of consciousness: awake and patient cooperative Pain management: pain level controlled Vital Signs Assessment: post-procedure vital signs reviewed and stable Respiratory status: spontaneous breathing and respiratory function stable Cardiovascular status: blood pressure returned to baseline Postop Assessment: no apparent nausea or vomiting Anesthetic complications: no     Last Vitals:  Vitals:   10/07/18 1300 10/07/18 1315  BP: 101/78 109/78  Pulse: 70 82  Resp: (!) 22 (!) 22  Temp:    SpO2: 98% 100%    Last Pain:  Vitals:   10/07/18 1315  TempSrc:   PainSc: 6                  Denean Pavon J

## 2018-10-10 ENCOUNTER — Encounter (HOSPITAL_COMMUNITY): Payer: Self-pay | Admitting: Urology

## 2018-10-11 ENCOUNTER — Other Ambulatory Visit: Payer: Self-pay

## 2018-10-11 ENCOUNTER — Telehealth: Payer: Self-pay | Admitting: Internal Medicine

## 2018-10-11 ENCOUNTER — Ambulatory Visit: Payer: Self-pay | Attending: Internal Medicine

## 2018-10-11 NOTE — Telephone Encounter (Signed)
I called Pt, since she had a TELE visit for financial I called her at 9:55am and 10:07am to try to reach her to go over the application, since she is not answering, I reschedule for 10/24/2018 at 2:30PM, if she can't make to reschedule appt

## 2018-10-21 ENCOUNTER — Ambulatory Visit (INDEPENDENT_AMBULATORY_CARE_PROVIDER_SITE_OTHER): Payer: Self-pay | Admitting: Urology

## 2018-10-21 ENCOUNTER — Other Ambulatory Visit (HOSPITAL_COMMUNITY)
Admission: AD | Admit: 2018-10-21 | Discharge: 2018-10-21 | Disposition: A | Discharge status: Home / Self Care | Payer: Self-pay | Source: Other Acute Inpatient Hospital | Attending: Urology | Admitting: Urology

## 2018-10-21 DIAGNOSIS — N3010 Interstitial cystitis (chronic) without hematuria: Secondary | ICD-10-CM

## 2018-10-21 DIAGNOSIS — R300 Dysuria: Secondary | ICD-10-CM

## 2018-10-21 DIAGNOSIS — R350 Frequency of micturition: Secondary | ICD-10-CM

## 2018-10-21 DIAGNOSIS — R3982 Chronic bladder pain: Secondary | ICD-10-CM

## 2018-10-21 LAB — URINALYSIS, COMPLETE (UACMP) WITH MICROSCOPIC
Bilirubin Urine: NEGATIVE
Glucose, UA: NEGATIVE mg/dL
Hgb urine dipstick: NEGATIVE
Ketones, ur: NEGATIVE mg/dL
Nitrite: NEGATIVE
Protein, ur: NEGATIVE mg/dL
Specific Gravity, Urine: 1.014 (ref 1.005–1.030)
pH: 5 (ref 5.0–8.0)

## 2018-10-22 LAB — URINE CULTURE: Culture: NO GROWTH

## 2018-10-24 ENCOUNTER — Ambulatory Visit: Payer: Self-pay | Attending: Internal Medicine

## 2018-10-24 ENCOUNTER — Encounter: Payer: Self-pay | Admitting: Radiology

## 2018-10-24 ENCOUNTER — Other Ambulatory Visit: Payer: Self-pay

## 2018-10-24 ENCOUNTER — Telehealth: Payer: Self-pay | Admitting: Internal Medicine

## 2018-10-24 NOTE — Telephone Encounter (Signed)
I called  Pt since she has a TELE visit for financial I will call back at 2:35 to try to reached the Pt

## 2018-11-03 ENCOUNTER — Telehealth: Payer: Self-pay | Admitting: Internal Medicine

## 2018-11-03 NOTE — Telephone Encounter (Signed)
Patient wants Dr Wynetta Emery  to rx something for her Bacterial Vaginosis  She use  [place]

## 2018-11-04 ENCOUNTER — Other Ambulatory Visit: Payer: Self-pay

## 2018-11-04 ENCOUNTER — Ambulatory Visit: Payer: Self-pay | Attending: Internal Medicine | Admitting: Internal Medicine

## 2018-11-04 DIAGNOSIS — N898 Other specified noninflammatory disorders of vagina: Secondary | ICD-10-CM

## 2018-11-04 MED ORDER — FLUCONAZOLE 150 MG PO TABS: ORAL_TABLET | ORAL | 0 refills | Status: DC

## 2018-11-04 MED ORDER — METRONIDAZOLE 500 MG PO TABS: 500.0000 mg | ORAL_TABLET | Freq: Two times a day (BID) | ORAL | 0 refills | Status: DC

## 2018-11-04 NOTE — Progress Notes (Signed)
Virtual Visit via Telephone Note Due to current restrictions/limitations of in-office visits due to the COVID-19 pandemic, this scheduled clinical appointment was converted to a telehealth visit  I connected with Alicia Cochran on 11/04/18 at 9:40 a.m by telephone and verified that I am speaking with the correct person using two identifiers. I am in my office.  The patient is at home.  Only the patient and myself participated in this encounter.  I discussed the limitations, risks, security and privacy concerns of performing an evaluation and management service by telephone and the availability of in person appointments. I also discussed with the patient that there may be a patient responsible charge related to this service. The patient expressed understanding and agreed to proceed.   History of Present Illness: Purpose of today's visit is to discuss vaginal irritation  Patient was seen about 1 month ago for vaginal irritation.  She was treated empirically with Diflucan.  Urine cytology came back positive for BV.  Given prescription for Flagyl.  STD screen was negative. Today pt c/o vaginal irritation again.  Little vaginal itching.  +white to clear vaginal dischg with bad odor Still with same partner. She reports that she did not take the Flagyl consistently that was prescribed last month because shortly after it was prescribed she had cystoscopy procedure for interstitial cystitis.  She states that she was placed on several medicines from that procedure that were making her sick to her stomach so she did not take the Flagyl consistently.  Observations/Objective: No direct observation done as this was a telephone encounter.  Assessment and Plan: 1. Vaginal irritation -We will treat empirically for yeast and bacterial vaginosis.  Advised her to take the full week course of the Flagyl.  Follow-up if symptoms persist. - metroNIDAZOLE (FLAGYL) 500 MG tablet; Take 1 tablet (500 mg total) by  mouth 2 (two) times daily.  Dispense: 14 tablet; Refill: 0 - fluconazole (DIFLUCAN) 150 MG tablet; TAKE 1 TABLET(150 MG) BY MOUTH 1 TIME FOR 1 DOSE  Dispense: 1 tablet; Refill: 0   Follow Up Instructions: PRN   I discussed the assessment and treatment plan with the patient. The patient was provided an opportunity to ask questions and all were answered. The patient agreed with the plan and demonstrated an understanding of the instructions.   The patient was advised to call back or seek an in-person evaluation if the symptoms worsen or if the condition fails to improve as anticipated.  I provided 4 minutes of non-face-to-face time during this encounter.   Karle Plumber, MD

## 2018-11-04 NOTE — Telephone Encounter (Signed)
Thank You.

## 2018-11-04 NOTE — Telephone Encounter (Signed)
Patient has been scheduled

## 2018-11-04 NOTE — Progress Notes (Signed)
Patient verified DOB Patient has not eaten today. Patient has not taken medication. Patient denies pain at this time. Patient has irritation for the past week. Last intercourse was last week with the same partner.  Patient states prior to her surgery she had BV and did not complete all the treatment because of the amount of medications she was on at the time, causing nausea, vomiting and diarrhea.

## 2018-11-25 ENCOUNTER — Telehealth: Payer: Self-pay | Admitting: Internal Medicine

## 2018-11-25 ENCOUNTER — Encounter: Payer: Self-pay | Admitting: Internal Medicine

## 2018-11-25 DIAGNOSIS — N898 Other specified noninflammatory disorders of vagina: Secondary | ICD-10-CM

## 2018-11-25 MED ORDER — FLUCONAZOLE 150 MG PO TABS: ORAL_TABLET | ORAL | 0 refills | Status: DC

## 2018-11-25 NOTE — Telephone Encounter (Signed)
Contacted pt and left a detailed of Dr. Wynetta Emery message

## 2018-11-25 NOTE — Telephone Encounter (Signed)
Pt states she started itching yesterday. Pt states she took the medicine that was last prescribed. Pt states she gets better then the symptoms comes back.   I have scheduled pt an appointment for [place] with the [facility].   Please f/u

## 2018-11-25 NOTE — Telephone Encounter (Signed)
Patient called stating she would like to schedule an appointment for a yeast infection however, there are no sooner appointments than 08/14. Please follow up

## 2018-11-30 ENCOUNTER — Ambulatory Visit: Payer: Self-pay | Attending: Family Medicine | Admitting: Physician Assistant

## 2018-11-30 ENCOUNTER — Other Ambulatory Visit: Payer: Self-pay

## 2018-11-30 VITALS — BP 95/60 | HR 70 | Temp 97.5°F | Ht 59.0 in | Wt 113.0 lb

## 2018-11-30 DIAGNOSIS — B3731 Acute candidiasis of vulva and vagina: Secondary | ICD-10-CM

## 2018-11-30 DIAGNOSIS — Z131 Encounter for screening for diabetes mellitus: Secondary | ICD-10-CM

## 2018-11-30 DIAGNOSIS — B373 Candidiasis of vulva and vagina: Secondary | ICD-10-CM

## 2018-11-30 LAB — POCT URINALYSIS DIPSTICK
Bilirubin, UA: NEGATIVE
Glucose, UA: NEGATIVE
Ketones, UA: NEGATIVE
Nitrite, UA: NEGATIVE
Protein, UA: NEGATIVE
Spec Grav, UA: 1.015 (ref 1.010–1.025)
Urobilinogen, UA: 0.2 E.U./dL
pH, UA: 6 (ref 5.0–8.0)

## 2018-11-30 LAB — GLUCOSE, POCT (MANUAL RESULT ENTRY): POC Glucose: 86 mg/dl (ref 70–99)

## 2018-11-30 NOTE — Progress Notes (Signed)
Patient ID: Alicia Cochran, female   DOB: [birth year], 28 y.o.   MRN: [id number]   Alicia Cochran, is a 28 y.o. female  [id number]  [id number]  DOB - [birth year]  Subjective:  Chief Complaint and HPI: Alicia Cochran is a 28 y.o. female here today  With recurrent vaginal irritation.  She has had BV and yeast frequently.  Pap WNL 03/2018.  She most recently took a diflucan about 5 days ago.  She denies abdominal pain.  No fever or pelvic pain.  IUD for about 1.5 years.  Monogamous with partner.  LMP about 1 week ago.  Admits to always having some discharge.  She denies any urinary s/sx.     ROS:   Constitutional:  No f/c, No night sweats, No unexplained weight loss. EENT:  No vision changes, No blurry vision, No hearing changes. No mouth, throat, or ear problems.  Respiratory: No cough, No SOB Cardiac: No CP, no palpitations GI:  No abd pain, No N/V/D. GU: No Urinary s/sx Musculoskeletal: No joint pain Neuro: No headache, no dizziness, no motor weakness.  Skin: No rash Endocrine:  No polydipsia. No polyuria.  Psych: Denies SI/HI  No problems updated.  ALLERGIES: No Known Allergies  PAST MEDICAL HISTORY: Past Medical History:  Diagnosis Date  . Cholestasis of pregnancy in third trimester   . Hx of chlamydia infection     MEDICATIONS AT HOME: Prior to Admission medications   Medication Sig Start Date End Date Taking? Authorizing Provider  amitriptyline (ELAVIL) 25 MG tablet Take 25 mg by mouth at bedtime.  08/31/18   [provider]  fluconazole (DIFLUCAN) 150 MG tablet TAKE 1 TABLET(150 MG) BY MOUTH 1 TIME FOR 1 DOSE 11/25/18   Marcine MatarJohnson, Deborah B, MD  HYDROcodone-acetaminophen (NORCO) 5-325 MG tablet Take 1 tablet by mouth every 6 (six) hours as needed for moderate pain. 10/07/18   Bjorn PippinWrenn, John, MD  metroNIDAZOLE (FLAGYL) 500 MG tablet Take 1 tablet (500 mg total) by mouth 2 (two) times daily. 11/04/18   Marcine MatarJohnson, Deborah B, MD  phenazopyridine  (PYRIDIUM) 200 MG tablet Take 1 tablet (200 mg total) by mouth 3 (three) times daily as needed for pain. 10/07/18   Bjorn PippinWrenn, John, MD     Objective:  EXAM:   Vitals:   11/30/18 1132  BP: 95/60  Pulse: 70  Temp: (!) 97.5 F (36.4 C)  TempSrc: Oral  SpO2: 98%  Weight: 113 lb (51.3 kg)  Height: 4\' 11"  (1.499 m)    General appearance : A&OX3. NAD. Non-toxic-appearing HEENT: Atraumatic and Normocephalic.  PERRLA. EOM intact.   Chest/Lungs:  Breathing-non-labored, Good air entry bilaterally, breath sounds normal without rales, rhonchi, or wheezing  CVS: S1 S2 regular, no murmurs, gallops, rubs  GU:  Speculum inserted.  Vaginal mucosa intact.  IUD in place.  Swabs taken.  Yellowish discharge in vault.  No lesions.  Bimanual unremarkable.   Neurology:  CN II-XII grossly intact, Non focal.   Psych:  TP linear. J/I WNL. Normal speech. Appropriate eye contact and affect.  Skin:  No Rash  Data Review No results found for: HGBA1C   Assessment & Plan   1. Screening for diabetes mellitus No diabetes- Glucose (CBG)  2. Vaginal yeast infection Recent Diflucan.  Leukocytes(?related to IUD/FB placement vs infection) in urine but asymptomatic.  Drink 80-100 ounces water daily.   - Urinalysis Dipstick - Urine Culture - Cervicovaginal ancillary only  Patient have been counseled extensively about nutrition and exercise  Return if symptoms worsen or  fail to improve.  The patient was given clear instructions to go to ER or return to medical center if symptoms don't improve, worsen or new problems develop. The patient verbalized understanding. The patient was told to call to get lab results if they haven't heard anything in the next week.     Freeman Caldron, PA-C [facility] [place], [place]   11/30/2018, 11:53 AM

## 2018-11-30 NOTE — Patient Instructions (Signed)
Drink 80-100 ounces of water daily.  If you develop fever, abdominal pain, or any severe symptoms, go to the nearest Urgent Care or Emergency dept.

## 2018-12-02 LAB — URINE CULTURE

## 2018-12-03 LAB — CERVICOVAGINAL ANCILLARY ONLY
Bacterial vaginitis: POSITIVE — AB
Candida vaginitis: NEGATIVE
Chlamydia: NEGATIVE
Neisseria Gonorrhea: NEGATIVE
Trichomonas: NEGATIVE

## 2018-12-06 ENCOUNTER — Telehealth: Payer: Self-pay | Admitting: *Deleted

## 2018-12-06 ENCOUNTER — Other Ambulatory Visit: Payer: Self-pay | Admitting: Physician Assistant

## 2018-12-06 DIAGNOSIS — N898 Other specified noninflammatory disorders of vagina: Secondary | ICD-10-CM

## 2018-12-06 MED ORDER — FLUCONAZOLE 150 MG PO TABS: ORAL_TABLET | ORAL | 0 refills | Status: DC

## 2018-12-06 MED ORDER — METRONIDAZOLE 500 MG PO TABS: 500.0000 mg | ORAL_TABLET | Freq: Two times a day (BID) | ORAL | 0 refills | Status: DC

## 2018-12-06 NOTE — Telephone Encounter (Signed)
Name and DOB verified. Patient aware of results for lab test.     Notes recorded by Carilyn Goodpasture, RN on 12/06/2018 at  9:22 AM EDT  Left message on voicemail to return call.  ------   Notes recorded by Argentina Donovan, PA-C on 12/06/2018 at 8:37 AM EDT  Your vaginal swabs were positive for bacterial vaginosis. I sent you a prescription to your pharmacy. No STD on testing. Follow-up as planned. Thanks, Freeman Caldron, PA-C

## 2018-12-09 ENCOUNTER — Ambulatory Visit: Payer: Self-pay | Admitting: Urology

## 2018-12-31 ENCOUNTER — Encounter: Payer: Self-pay | Admitting: Internal Medicine

## 2019-01-04 ENCOUNTER — Ambulatory Visit: Payer: Self-pay | Attending: Family Medicine | Admitting: Family Medicine

## 2019-01-04 ENCOUNTER — Other Ambulatory Visit: Payer: Self-pay

## 2019-01-04 ENCOUNTER — Encounter: Payer: Self-pay | Admitting: Family Medicine

## 2019-01-04 VITALS — BP 105/67 | HR 71 | Temp 98.2°F | Resp 18 | Ht 59.0 in | Wt 113.0 lb

## 2019-01-04 DIAGNOSIS — N3010 Interstitial cystitis (chronic) without hematuria: Secondary | ICD-10-CM

## 2019-01-04 DIAGNOSIS — N898 Other specified noninflammatory disorders of vagina: Secondary | ICD-10-CM

## 2019-01-04 DIAGNOSIS — R1024 Suprapubic pain: Secondary | ICD-10-CM

## 2019-01-04 DIAGNOSIS — R102 Pelvic and perineal pain: Secondary | ICD-10-CM

## 2019-01-04 MED ORDER — FLUCONAZOLE 150 MG PO TABS: 150.0000 mg | ORAL_TABLET | Freq: Once | ORAL | 0 refills | Status: AC

## 2019-01-04 MED ORDER — NYSTATIN 100000 UNIT/GM EX OINT: 1.0000 "application " | TOPICAL_OINTMENT | Freq: Two times a day (BID) | CUTANEOUS | 1 refills | Status: DC

## 2019-01-04 MED ORDER — PHENAZOPYRIDINE HCL 100 MG PO TABS: 100.0000 mg | ORAL_TABLET | Freq: Three times a day (TID) | ORAL | 0 refills | Status: DC | PRN

## 2019-01-04 MED FILL — FLUCONAZOLE 150 MG TABLET: 150 | 3 days supply | Qty: 2 | Fill #0

## 2019-01-04 MED FILL — NYSTATIN 100,000 UNITS/GM O: 100000 | 15 days supply | Qty: 30 | Fill #0

## 2019-01-04 NOTE — Progress Notes (Signed)
Established Patient Office Visit  Subjective:  Patient ID: Alicia Cochran, female    DOB: Mar 06, 1991  Age: 28 y.o. MRN: [id number]  CC: No chief complaint on file.   HPI Alicia Cochran presents for acute visit due to complaint of abdominal discomfort similar to prior urinary tract infections.  She also has complaint of feeling as if she has irritation in the vaginal area but denies any vaginal discharge.  She reports that she has had recurrent urinary tract infections for which she has seen urology in the past.  She also feels that she develops recurrent yeast infections.  She does not feel that she is going any more frequently than normal-she does tend to urinate several times per day but this is been longstanding. She does have possible urgency when she feels as if she does have to urinate.  Urination/emptying the bladder does decrease the mid lower abdominal discomfort.  She feels that she has some discomfort with urination but not as intense as with prior urinary tract infections.  She denies any other abdominal pain.  No nausea or vomiting.  She has had no fever or chills.  She has felt slightly fatigued.  She thinks that her symptoms have worsened over the past 2 to 3 days.  No chest pain or palpitations, no shortness of breath or cough, no increased back pain.  Past Medical History:  Diagnosis Date  . Cholestasis of pregnancy in third trimester   . Hx of chlamydia infection     Past Surgical History:  Procedure Laterality Date  . CYSTO WITH HYDRODISTENSION N/A 10/07/2018   Procedure: CYSTOSCOPY/HYDRODISTENSION INSTILL MARCAINE AND PYRIDIUM;  Surgeon: Bjorn Pippin, MD;  Location: [facility];  Service: Urology;  Laterality: N/A;    Family History  Problem Relation Age of Onset  . Asthma Mother   . Diabetes Maternal Grandmother   . Alcohol abuse Neg Hx   . Arthritis Neg Hx   . Birth defects Neg Hx   . Cancer Neg Hx   . COPD Neg Hx   . Depression Neg Hx   . Drug abuse  Neg Hx   . Early death Neg Hx   . Hearing loss Neg Hx   . Heart disease Neg Hx   . Hyperlipidemia Neg Hx   . Hypertension Neg Hx   . Kidney disease Neg Hx   . Learning disabilities Neg Hx   . Mental illness Neg Hx   . Mental retardation Neg Hx   . Miscarriages / Stillbirths Neg Hx   . Stroke Neg Hx   . Vision loss Neg Hx   . Varicose Veins Neg Hx     Social History   Socioeconomic History  . Marital status: Single    Spouse name: Not on file  . Number of children: Not on file  . Years of education: Not on file  . Highest education level: Not on file  Occupational History  . Not on file  Social Needs  . Financial resource strain: Not on file  . Food insecurity    Worry: Not on file    Inability: Not on file  . Transportation needs    Medical: Not on file    Non-medical: Not on file  Tobacco Use  . Smoking status: Never Smoker  . Smokeless tobacco: Never Used  Substance and Sexual Activity  . Alcohol use: No    Alcohol/week: 0.0 standard drinks  . Drug use: No  . Sexual activity: Yes  Lifestyle  .  Physical activity    Days per week: Not on file    Minutes per session: Not on file  . Stress: Not on file  Relationships  . Social Herbalist on phone: Not on file    Gets together: Not on file    Attends religious service: Not on file    Active member of club or organization: Not on file    Attends meetings of clubs or organizations: Not on file    Relationship status: Not on file  . Intimate partner violence    Fear of current or ex partner: Not on file    Emotionally abused: Not on file    Physically abused: Not on file    Forced sexual activity: Not on file  Other Topics Concern  . Not on file  Social History Narrative   ** Merged History Encounter **        Outpatient Medications Prior to Visit  Medication Sig Dispense Refill  . amitriptyline (ELAVIL) 25 MG tablet Take 25 mg by mouth at bedtime.     . fluconazole (DIFLUCAN) 150 MG tablet  TAKE 1 TABLET(150 MG) BY MOUTH 1 TIME FOR 1 DOSE 1 tablet 0  . HYDROcodone-acetaminophen (NORCO) 5-325 MG tablet Take 1 tablet by mouth every 6 (six) hours as needed for moderate pain. 6 tablet 0  . metroNIDAZOLE (FLAGYL) 500 MG tablet Take 1 tablet (500 mg total) by mouth 2 (two) times daily. 14 tablet 0  . phenazopyridine (PYRIDIUM) 200 MG tablet Take 1 tablet (200 mg total) by mouth 3 (three) times daily as needed for pain. 10 tablet 0   No facility-administered medications prior to visit.     No Known Allergies  ROS Review of Systems  Constitutional: Positive for fatigue. Negative for chills and fever.  HENT: Negative for sore throat and trouble swallowing.   Respiratory: Negative for cough and shortness of breath.   Cardiovascular: Negative for chest pain, palpitations and leg swelling.  Gastrointestinal: Positive for abdominal pain. Negative for blood in stool, constipation, diarrhea and nausea.  Endocrine: Negative for cold intolerance, heat intolerance, polydipsia, polyphagia and polyuria.  Genitourinary: Positive for frequency and urgency. Negative for difficulty urinating, dysuria, pelvic pain, vaginal bleeding, vaginal discharge and vaginal pain.  Musculoskeletal: Negative for arthralgias and back pain.  Neurological: Negative for dizziness and headaches.  Hematological: Negative for adenopathy. Does not bruise/bleed easily.      Objective:    Physical Exam  Constitutional: She is oriented to person, place, and time. She appears well-developed and well-nourished.  Cardiovascular: Normal rate and regular rhythm.  Pulmonary/Chest: Effort normal and breath sounds normal.  Abdominal: Soft. There is abdominal tenderness (Mild suprapubic discomfort to palpation). There is no rebound and no guarding.  Musculoskeletal:        General: No tenderness or edema.     Comments: No CVA tenderness  Neurological: She is alert and oriented to person, place, and time.  Skin:  She appears  to have some mild erythema and shiny/atrophic appearance to the skin in the vaginal area/outer labia  Psychiatric: She has a normal mood and affect. Her behavior is normal.  Patient initially appeared slightly anxious  Nursing note and vitals reviewed.   BP 105/67 (BP Location: Left Arm, Patient Position: Sitting, Cuff Size: Normal)   Pulse 71   Temp 98.2 F (36.8 C) (Oral)   Resp 18   Ht 4\' 11"  (1.499 m)   Wt 113 lb (51.3 kg)   SpO2  98%   BMI 22.82 kg/m  Wt Readings from Last 3 Encounters:  11/30/18 113 lb (51.3 kg)  10/07/18 114 lb (51.7 kg)  09/30/18 114 lb 3.2 oz (51.8 kg)     Health Maintenance Due  Topic Date Due  . TETANUS/TDAP  11/11/2009  . INFLUENZA VACCINE  11/26/2018     Lab Results  Component Value Date   TSH 1.550 11/10/2016   Lab Results  Component Value Date   WBC 6.2 11/10/2016   HGB 13.7 11/10/2016   HCT 41.1 11/10/2016   MCV 94 11/10/2016   PLT 267 11/10/2016   Lab Results  Component Value Date   NA 141 11/10/2016   K 4.0 11/10/2016   CO2 23 11/10/2016   GLUCOSE 76 11/10/2016   BUN 11 11/10/2016   CREATININE 0.60 11/10/2016   BILITOT 0.3 11/10/2016   ALKPHOS 50 11/10/2016   AST 21 11/10/2016   ALT 39 (H) 11/10/2016   PROT 6.8 11/10/2016   ALBUMIN 4.6 11/10/2016   CALCIUM 9.3 11/10/2016   No results found for: CHOL No results found for: HDL No results found for: LDLCALC No results found for: TRIG No results found for: CHOLHDL No results found for: [id number]    Assessment & Plan:  1. Vaginal irritation Patient believes that she may have another yeast infection.  She does have some irritation of the skin in the genital area.  Patient performed self swab for cervico- vaginal ancillary testing.  She was prescribed Diflucan as well as prescription for nystatin ointment for the exterior itching.  Patient will also have urinalysis.  She should call or return if the irritation is not improving within the next 5 to 7 days with treatment. -  POCT URINALYSIS DIP (CLINITEK) - Cervicovaginal ancillary only - fluconazole (DIFLUCAN) 150 MG tablet; Take 1 tablet (150 mg total) by mouth once for 1 dose. Repeat dose in 3 days  Dispense: 2 tablet; Refill: 0 - nystatin ointment (MYCOSTATIN); Apply 1 application topically 2 (two) times daily. As needed  Dispense: 30 g; Refill: 1  2. Suprapubic discomfort; 3. Interstitial cystitis Patient with suprapubic discomfort and has had recurrent urinary tract infections and on review of notes from urology, patient also likely with interstitial cystitis.  Urinalysis done to determine if patient might have a recurrence of urinary tract infection as the course of her discomfort.  Also discussed with the patient and plan interstitial cystitis can cause similar discomfort without infection being present.  She was given educational material on interstitial cystitis as part of her after visit summary and discussed dietary changes that may help with interstitial cystitis.  Prescription provided for Pyridium which may help with bladder discomfort.  No abnormal findings on today's urinalysis however since patient with symptoms suggestive of urinary tract infection and history of recurrent UTI, urine will be sent for culture.  Patient will be notified of the urine culture results and if any further treatment is needed based on these results.  Patient encouraged to schedule follow-up with urology if her symptoms continue. -POCT urinalysis DIP - Urine Culture   An After Visit Summary was printed and given to the patient.  Follow-up: Return in about 2 weeks (around 01/18/2019) for bladder issues-2 week f/u with PCP.    Cain Saupeammie Sanae Willetts, MD

## 2019-01-04 NOTE — Patient Instructions (Signed)
Interstitial Cystitis ° °Interstitial cystitis is inflammation of the bladder. This may cause pain in the bladder area as well as a frequent and urgent need to urinate. The bladder is a hollow organ in the lower part of the abdomen. It stores urine after the urine is made in the kidneys. °The severity of interstitial cystitis can vary from person to person. You may have flare-ups, and then your symptoms may go away for a while. For many people, it becomes a long-term (chronic) problem. °What are the causes? °The cause of this condition is not known. °What increases the risk? °The following factors may make you more likely to develop this condition: °· You are female. °· You have fibromyalgia. °· You have irritable bowel syndrome (IBS). °· You have endometriosis. °This condition may be aggravated by: °· Stress. °· Smoking. °· Spicy foods. °What are the signs or symptoms? °Symptoms of interstitial cystitis vary, and they can change over time. Symptoms may include: °· Discomfort or pain in the bladder area, which is in the lower abdomen. Pain can range from mild to severe. The pain may change in intensity as the bladder fills with urine or as it empties. °· Pain in the pelvic area, between the hip bones. °· An urgent need to urinate. °· Frequent urination. °· Pain during urination. °· Pain during sex. °· Blood in the urine. °For women, symptoms often get worse during menstruation. °How is this diagnosed? °This condition is diagnosed based on your symptoms, your medical history, and a physical exam. You may have tests to rule out other conditions, such as: °· Urine tests. °· Cystoscopy. For this test, a tool similar to a very thin telescope is used to look into your bladder. °· Biopsy. This involves taking a sample of tissue from the bladder to be examined under a microscope. °How is this treated? °There is no cure for this condition, but treatment can help you control your symptoms. Work closely with your health care  provider to find the most effective treatments for you. Treatment options may include: °· Medicines to relieve pain and reduce how often you feel the need to urinate. °· Learning ways to control when you urinate (bladder training). °· Lifestyle changes, such as changing your diet or taking steps to control stress. °· Using a device that provides electrical stimulation to your nerves, which can relieve pain (neuromodulation therapy). The device is placed on your back, where it blocks the nerves that cause you to feel pain in your bladder area. °· A procedure that stretches your bladder by filling it with air or fluid. °· Surgery. This is rare. It is only done for extreme cases, if other treatments do not help. °Follow these instructions at home: °Bladder training ° °· Use bladder training techniques as directed. Techniques may include: °? Urinating at scheduled times. °? Training yourself to delay urination. °? Doing exercises (Kegel exercises) to strengthen the muscles that control urine flow. °· Keep a bladder diary. °? Write down the times that you urinate and any symptoms that you have. This can help you find out which foods, liquids, or activities make your symptoms worse. °? Use your bladder diary to schedule bathroom trips. If you are away from home, plan to be near a bathroom at each of your scheduled times. °· Make sure that you urinate just before you leave the house and just before you go to bed. °Eating and drinking °· Make dietary changes as recommended by your health care provider. You   may need to avoid: °? Spicy foods. °? Foods that contain a lot of potassium. °· Limit your intake of beverages that make you need to urinate. These include: °? Caffeinated beverages like soda, coffee, and tea. °? Alcohol. °General instructions °· Take over-the-counter and prescription medicines only as told by your health care provider. °· Do not drink alcohol. °· You can try a warm or cool compress over your bladder for  comfort. °· Avoid wearing tight clothing. °· Do not use any products that contain nicotine or tobacco, such as cigarettes and e-cigarettes. If you need help quitting, ask your health care provider. °· Keep all follow-up visits as told by your health care provider. This is important. °Contact a health care provider if you have: °· Symptoms that do not get better with treatment. °· Pain or discomfort that gets worse. °· More frequent urges to urinate. °· A fever. °Get help right away if: °· You have no control over when you urinate. °Summary °· Interstitial cystitis is inflammation of the bladder. °· This condition may cause pain in the bladder area as well as a frequent and urgent need to urinate. °· You may have flare-ups of the condition, and then it may go away for a while. For many people, it becomes a long-term (chronic) problem. °· There is no cure for interstitial cystitis, but treatment methods are available to control your symptoms. °This information is not intended to replace advice given to you by your health care provider. Make sure you discuss any questions you have with your health care provider. °Document Released: 12/13/2003 Document Revised: 03/26/2017 Document Reviewed: 03/08/2017 °Elsevier Patient Education © 2020 [employer] ° ° °Eating Plan for Interstitial Cystitis °Interstitial cystitis (IC) is a long-term (chronic) condition that causes pain and pressure in the bladder, the lower abdomen, and the pelvic area. Other symptoms of IC include urinary urgency and frequency. Symptoms tend to come and go. °Many people with IC find that certain foods trigger their symptoms. Different foods may be problematic for different people. Some foods are more likely to cause symptoms than others. Learning which foods bother you and which do not can help you come up with an eating plan to manage IC. °What are tips for following this plan? °You may find it helpful to work with a dietitian. This health care  provider can help you develop your eating plan by doing an elimination diet, which involves these steps: °· Start with a list of foods that you think trigger your IC symptoms along with the foods that most commonly trigger symptoms for many people with IC. °· Eliminate those foods from your diet for about one month, then start reintroducing the foods one at a time to see which ones trigger your symptoms. °· Make a list of the foods that trigger your symptoms. It may take several months to find out which foods bother you. °Reading food labels °Once you know which foods trigger your IC symptoms, you can avoid them. However, it is also a good idea to read food labels because some foods that trigger your symptoms may be included as ingredients in other foods. These ingredients may include: °· Chili peppers. °· Tomato products. °· Soy. °· Worcestershire sauce. °· Vinegar. °· Alcohol. °· Citrus flavors or juices. °· Artificial sweeteners. °· Monosodium glutamate. °Shopping °· Shopping can be a challenge if many foods trigger your IC. When you go grocery shopping, bring a list of the foods you can eat. °· You can get an   app for your phone that lets you know which foods are the safest and which you may want to avoid. You can find the app at the Interstitial Cystitis Network website: www.ic-network.com °Meal planning °· Plan your meals according to the results of your elimination diet. If you have not done an elimination diet, plan meals according to IC food lists recommended by your health care provider or dietitian. These lists tell you which foods are least and most likely to cause symptoms. °· Avoid certain types of food when you go out to eat, such as pizza and foods typically served at Indian, Mexican, and Thai restaurants. These foods often contain ingredients that can aggravate IC. °General information °Here are some general guidelines for an IC eating plan: °· Do not eat large portions. °· Drink plenty of fluids  with your meals. °· Do not eat foods that are high in sugar, salt, or saturated fat. °· Choose whole fruits instead of juice. °· Eat a colorful variety of vegetables. °What foods should I eat? °For people with IC, the best diet is a balanced one that includes things from all the food groups. Even if you have to avoid certain foods, there are still plenty of healthy choices in each group. The following are some foods that are least bothersome and may be safest to eat: °Fruits °Bananas. Blueberries and blueberry juice. Melons. Pears. Apples. Dates. Prunes. Raisins. Apricots. °Vegetables °Asparagus. Avocado. Celery. Beets. Bell peppers. Black olives. Broccoli. Brussels sprouts. Cabbage. Carrots. Cauliflower. Cucumber. Eggplant. Green beans. Potatoes. Radishes. Spinach. Squash. Turnips. Zucchini. Mushrooms. Peas. °Grains °Oats. Rice. Bran. Oatmeal. Whole wheat bread. °Meats and other proteins °Beef. Fish and other seafood. Eggs. Nuts. Peanut butter. Pork. Poultry. Lamb. Garbanzo beans. Pinto beans. °Dairy °Whole or low-fat milk. American, mozzarella, mild cheddar, feta, ricotta, and cream cheeses. °The items listed above may not be a complete list of foods and beverages you can eat. Contact a dietitian for more information. °What foods should I avoid? °You should avoid any foods that seem to trigger your symptoms. It is also a good idea to avoid foods that are most likely to cause symptoms in many people with IC. These include the following: °Fruits °Citrus fruits, including lemons, limes, oranges, and grapefruit. Cranberries. Strawberries. Pineapple. Kiwi. °Vegetables °Chili peppers. Onions. Sauerkraut. Tomato and tomato products. Pickles. °Grains °You do not need to avoid any type of grain unless it triggers your symptoms. °Meats and other proteins °Precooked or cured meats, such as sausages or meat loaves. Soy products. °Dairy °Chocolate ice cream. Processed cheese. Yogurt. °Beverages °Alcohol. Chocolate drinks.  Coffee. Cranberry juice. Carbonated drinks. Tea (black, green, or herbal). Tomato juice. Sports drinks. °The items listed above may not be a complete list of foods and beverages you should avoid. Contact a dietitian for more information. °Summary °· Many people with IC find that certain foods trigger their symptoms. Different foods may be problematic for different people. Some foods are more likely to cause symptoms than others. °· You may find it helpful to work with a dietitian to do an elimination diet and come up with an eating plan that is right for you. °· Plan your meals according to the results of your elimination diet. If you have not done an elimination diet, plan your meals using IC food lists. These lists tell you which foods are least and most likely to cause symptoms. °· The best diet for people with IC is a balanced diet that includes foods from all the food groups. Even   if you have to avoid certain foods, there are still plenty of healthy choices in each group. °This information is not intended to replace advice given to you by your health care provider. Make sure you discuss any questions you have with your health care provider. °Document Released: 12/16/2017 Document Revised: 08/04/2018 Document Reviewed: 12/16/2017 °Elsevier Patient Education © 2020 [employer]vier Inc. ° °

## 2019-01-06 LAB — CERVICOVAGINAL ANCILLARY ONLY
Bacterial vaginitis: NEGATIVE
Candida vaginitis: NEGATIVE
Chlamydia: NEGATIVE
Neisseria Gonorrhea: NEGATIVE
Trichomonas: NEGATIVE

## 2019-01-06 LAB — URINE CULTURE

## 2019-01-10 ENCOUNTER — Other Ambulatory Visit: Payer: Self-pay | Admitting: Family Medicine

## 2019-01-10 DIAGNOSIS — N3000 Acute cystitis without hematuria: Secondary | ICD-10-CM

## 2019-01-10 MED ORDER — AMOXICILLIN-POT CLAVULANATE 500-125 MG PO TABS: 1.0000 | ORAL_TABLET | Freq: Two times a day (BID) | ORAL | 0 refills | Status: AC

## 2019-01-10 NOTE — Progress Notes (Signed)
Patient ID: Alicia Cochran, female   DOB: 26-Jun-1990, 28 y.o.   MRN: [id number]   Urine culture showed growth of bacteria consistent with UTI and RX will be sent into patient's pharmacy for Augmentin 500 mg twice per day for 7 days and patient will be encouraged to have repeat urine culture in 2 weeks to make sure that infection has resolved

## 2019-01-15 IMAGING — CT CT ABD-PELV W/O CM
2 of 4 series · 17 of 46 positions shown, 19 images · non-contrast
Comparison: None.

CLINICAL DATA: Right lower quadrant pain for 3 months

EXAM:
CT ABDOMEN AND PELVIS WITHOUT CONTRAST
TECHNIQUE: Multidetector CT imaging of the abdomen and pelvis was performed
following the standard protocol without IV contrast.

[Series 3: abd/ pelvis 5.0 i30f 2 · axial · 0.66mm/px · z∈[-466,-111]mm · 14 of 79 slices shown, 16 images]
[im 4/79  soft-tissue]
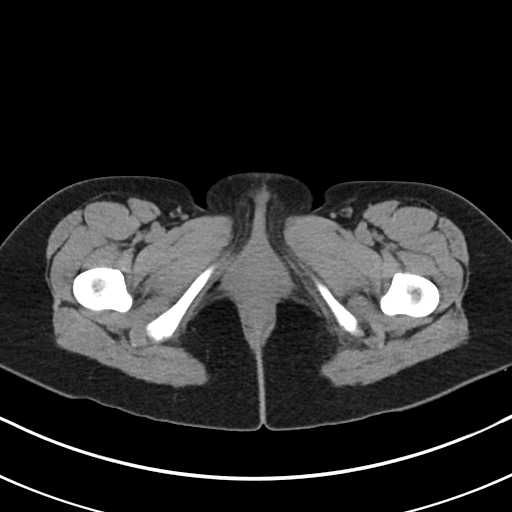
[im 4/79  bone]
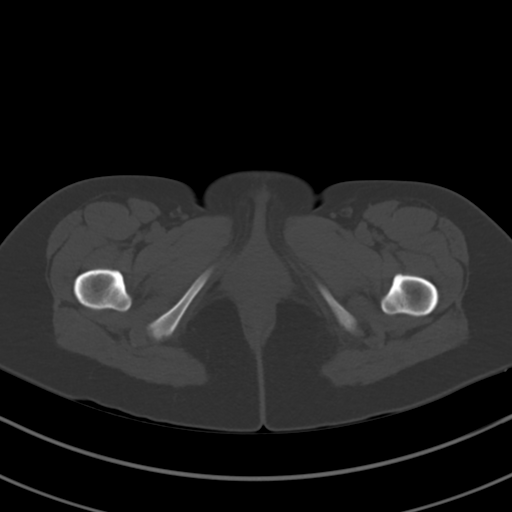
[im 10/79  soft-tissue]
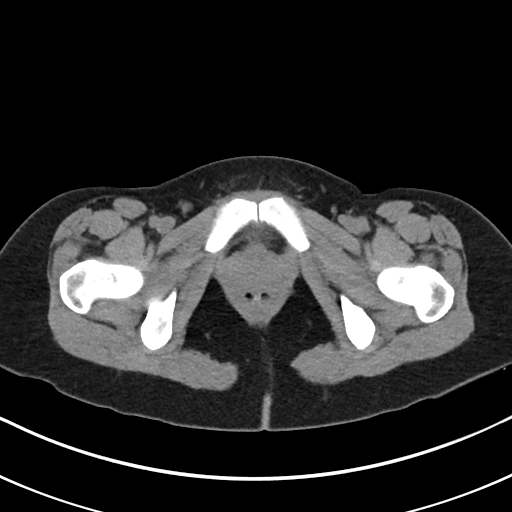
[im 16/79  soft-tissue]
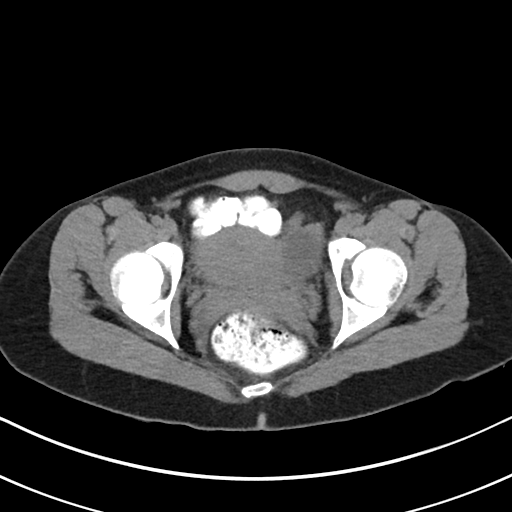
[im 22/79  soft-tissue]
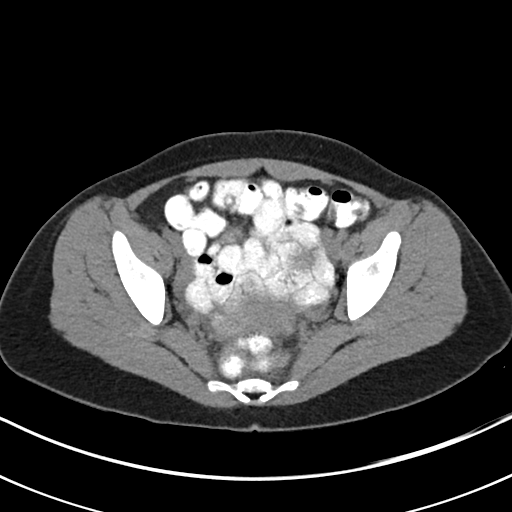
[im 25/79  soft-tissue]
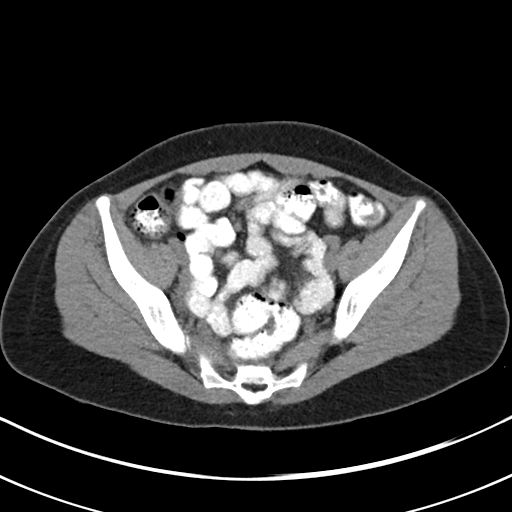
[im 32/79  soft-tissue]
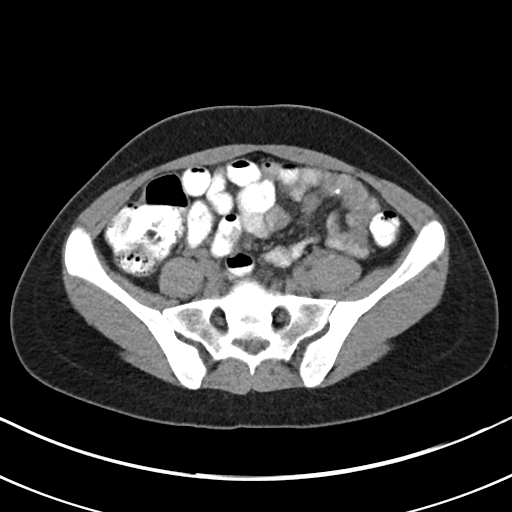
[im 38/79  soft-tissue]
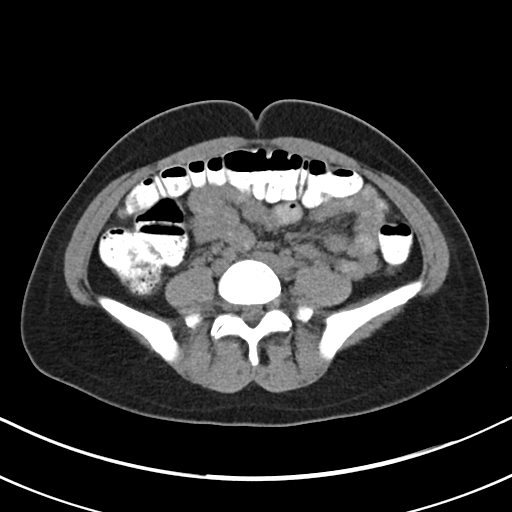
[im 41/79  soft-tissue]
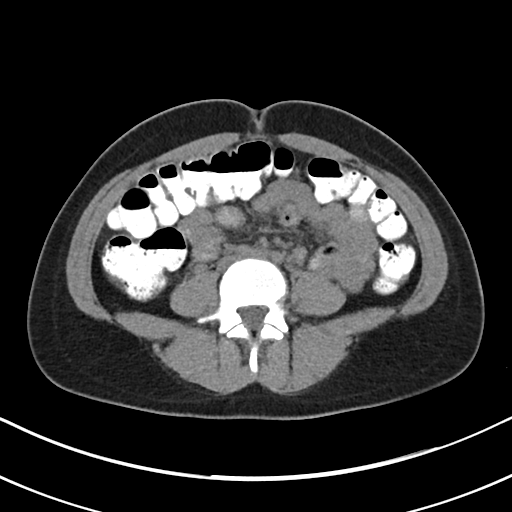
[im 47/79  soft-tissue]
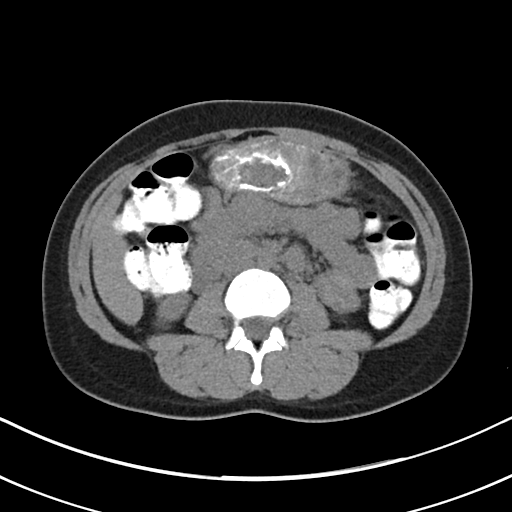
[im 47/79  bone]
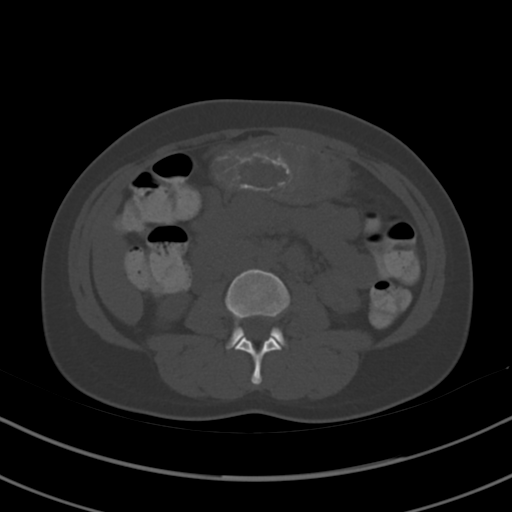
[im 54/79  soft-tissue]
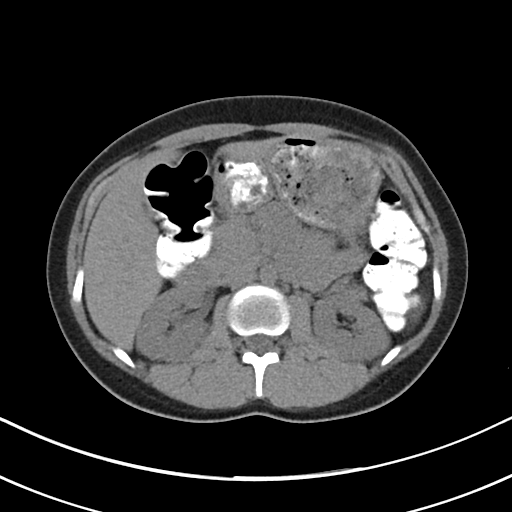
[im 60/79  soft-tissue]
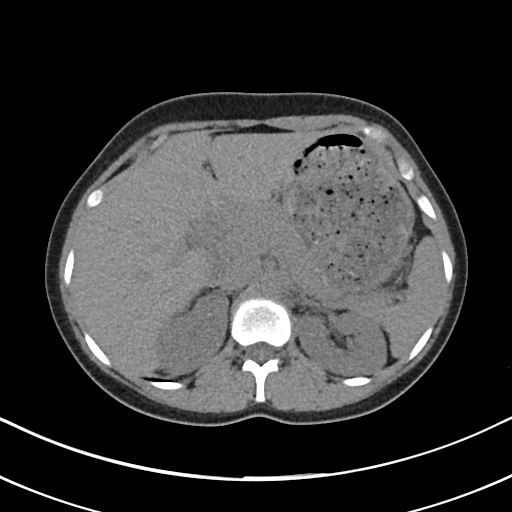
[im 63/79  soft-tissue]
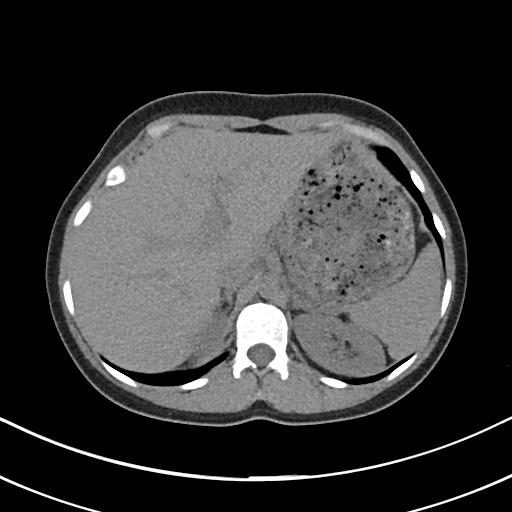
[im 69/79  soft-tissue]
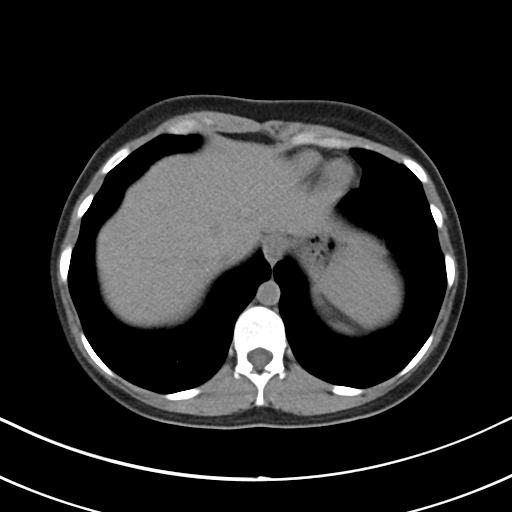
[im 75/79  soft-tissue]
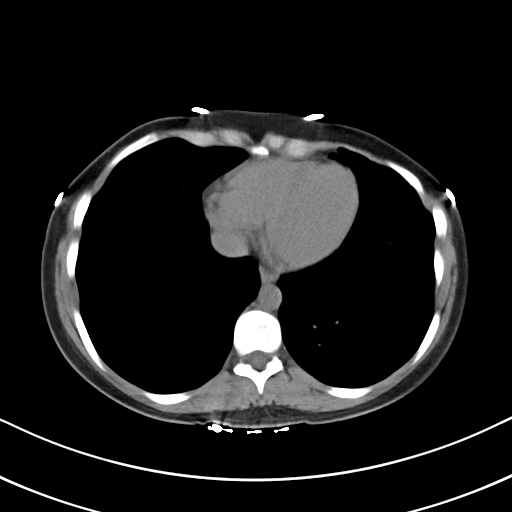

[Series 6: cor st · coronal · 0.64mm/px · 3 of 101 slices shown]
[im 34/101  soft-tissue]
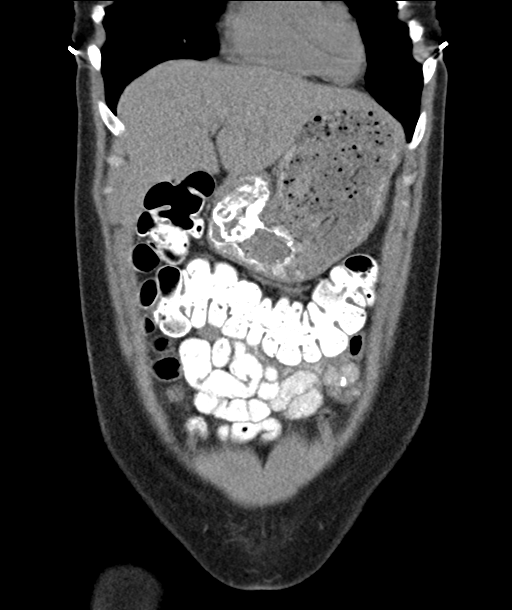
[im 45/101  soft-tissue]
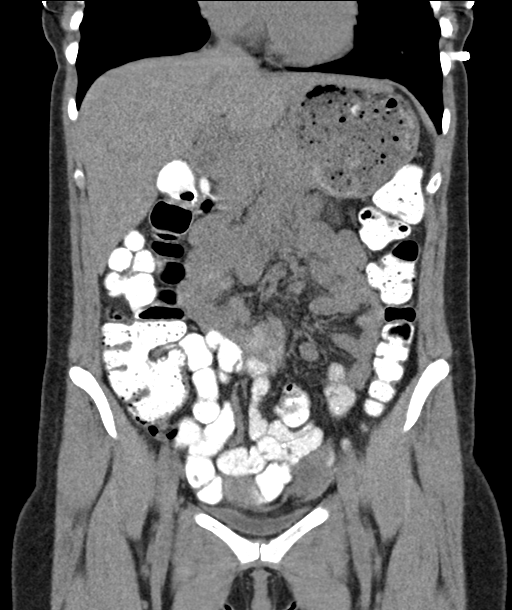
[im 56/101  soft-tissue]
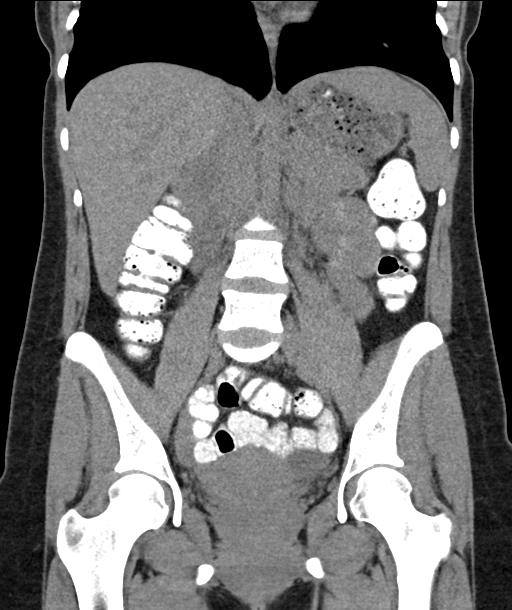

[17 of 46 positions shown; findings below may reference images not displayed]

FINDINGS: Lower chest: Lung bases are clear. No effusions. Heart is normal
size.

Hepatobiliary: No focal hepatic abnormality. Gallbladder not
visualized, question contracted versus prior cholecystectomy.

Pancreas: No focal abnormality or ductal dilatation.

Spleen: No focal abnormality.  Normal size.

Adrenals/Urinary Tract: No adrenal abnormality. No focal renal
abnormality. No stones or hydronephrosis. Urinary bladder is
unremarkable.

Stomach/Bowel: Normal appendix. Stomach, large and small bowel
grossly unremarkable.

Vascular/Lymphatic: No evidence of aneurysm or adenopathy.

Reproductive: Uterus and right adnexa unremarkable. 3.6 cm cyst in
the left ovary.

Other: No free fluid or free air.

Musculoskeletal: No acute bony abnormality.
IMPRESSION: 3.6 cm cyst in the left ovary, likely functional cyst.

Normal appendix.

No renal or ureteral stones.  No hydronephrosis.

## 2019-01-20 ENCOUNTER — Other Ambulatory Visit: Payer: Self-pay | Admitting: Family Medicine

## 2019-01-20 ENCOUNTER — Telehealth: Payer: Self-pay | Admitting: Internal Medicine

## 2019-01-20 MED ORDER — FLUCONAZOLE 150 MG PO TABS: 150.0000 mg | ORAL_TABLET | Freq: Once | ORAL | 1 refills | Status: AC

## 2019-01-20 NOTE — Telephone Encounter (Signed)
Informed patient with with provider stated and she verbalized understanding.

## 2019-01-20 NOTE — Telephone Encounter (Signed)
Notify patient that RX was sent to [facility] for diflucan for treatment of yeast infection

## 2019-01-20 NOTE — Progress Notes (Signed)
Patient ID: Alicia Cochran, female   DOB: 1990-09-14, 28 y.o.   MRN: [id number]   Phone request received that patient wants a pill for possible yeast infection after recent antibiotic therapy

## 2019-01-20 NOTE — Telephone Encounter (Signed)
Request medication to help with vaginal itching after taking ATB.

## 2019-01-20 NOTE — Telephone Encounter (Signed)
New Message   Pt states that she was given an antibiotic at her last visit but she is still having vaginal itching and inflammation. Please f/u

## 2019-01-23 NOTE — Telephone Encounter (Signed)
Please ask patient to schedule a follow-up with her PCP regarding the need for additional ABx treatment

## 2019-01-23 NOTE — Telephone Encounter (Signed)
Pt called to request medication -amoxicillin-clavulanate (AUGMENTIN) 500-125 MG tablet  To -[place], [place] Pt states this medication works best, please follow up as she would like her nurse to call her back if medication is not able to be prescribed

## 2019-01-24 NOTE — Telephone Encounter (Addendum)
Left message on voicemail informing patient of in person OV. May call office back at earliest convenience for additional questions.

## 2019-01-25 NOTE — Progress Notes (Signed)
Virtual Visit via Telephone Note  I connected with [name] on 01/25/19 at 11:10 AM EDT by telephone and verified that I am speaking with the correct person using two identifiers.   I discussed the limitations, risks, security and privacy concerns of performing an evaluation and management service by telephone and the availability of in person appointments. I also discussed with the patient that there may be a patient responsible charge related to this service. The patient expressed understanding and agreed to proceed.  Patient location: home My Location:  home office Persons on the call:  Me and the patient   History of Present Illness: Patient has been having trouble with recurrent BV, yeast, group B strep, and UTI.  Having vaginal discahrge again and just finished augmentin and diflucan.  No f/c.  Denies STD risk factors.  No abdominal/pelvic pain.  No f/c.     Observations/Objective:  NAD.   Assessment and Plan: 1. History of UTI Start probiotics >10 bu daily, drink 80-100 oz water daily, wear cotton-full-sized panties, genitalia soaps, etc reviewed. - POCT URINALYSIS DIP (CLINITEK) - Urine cytology ancillary only - Urine Culture   Follow Up Instructions: See pcp prn  I discussed the assessment and treatment plan with the patient. The patient was provided an opportunity to ask questions and all were answered. The patient agreed with the plan and demonstrated an understanding of the instructions.   The patient was advised to call back or seek an in-person evaluation if the symptoms worsen or if the condition fails to improve as anticipated.  I provided 13 minutes of non-face-to-face time during this encounter.   Alicia Caldron, PA-C  Patient ID: Alicia Cochran, female   DOB: 12/04/1990, 28 y.o.   MRN: [id number]

## 2019-01-26 ENCOUNTER — Ambulatory Visit: Payer: Self-pay | Attending: Family Medicine | Admitting: Physician Assistant

## 2019-01-26 ENCOUNTER — Other Ambulatory Visit: Payer: Self-pay

## 2019-01-26 DIAGNOSIS — Z87440 Personal history of urinary (tract) infections: Secondary | ICD-10-CM

## 2019-01-26 DIAGNOSIS — N898 Other specified noninflammatory disorders of vagina: Secondary | ICD-10-CM

## 2019-01-26 LAB — POCT URINALYSIS DIP (CLINITEK)
Bilirubin, UA: NEGATIVE
Blood, UA: NEGATIVE
Glucose, UA: NEGATIVE mg/dL
Ketones, POC UA: NEGATIVE mg/dL
Leukocytes, UA: NEGATIVE
Nitrite, UA: NEGATIVE
POC PROTEIN,UA: NEGATIVE
Spec Grav, UA: 1.02 (ref 1.010–1.025)
Urobilinogen, UA: 0.2 E.U./dL
pH, UA: 7 (ref 5.0–8.0)

## 2019-01-27 ENCOUNTER — Telehealth: Payer: Self-pay | Admitting: Internal Medicine

## 2019-01-27 NOTE — Telephone Encounter (Signed)
It appears that the urinalysis that she had at yesterday's visit was normal and there are no other lab results back from her visit with Freeman Caldron yesterday

## 2019-01-27 NOTE — Telephone Encounter (Signed)
Patient called wanting to get an update on her results. Please follow up.

## 2019-01-28 LAB — URINE CULTURE: Organism ID, Bacteria: NO GROWTH

## 2019-01-31 ENCOUNTER — Telehealth: Payer: Self-pay | Admitting: Internal Medicine

## 2019-01-31 ENCOUNTER — Telehealth: Payer: Self-pay | Admitting: *Deleted

## 2019-01-31 ENCOUNTER — Encounter: Payer: Self-pay | Admitting: Physician Assistant

## 2019-01-31 NOTE — Telephone Encounter (Signed)
New Message  1) Medication(s) Requested (by name): amitriptyline (ELAVIL) 25 MG tablet  2) Pharmacy of Choice: [facility]  3) Special Requests: Pt states she is needing this medication to sleep   Approved medications will be sent to the pharmacy, we will reach out if there is an issue.  Requests made after 3pm may not be addressed until the following business day!  If a patient is unsure of the name of the medication(s) please note and ask patient to call back when they are able to provide all info, do not send to responsible party until all information is available!

## 2019-01-31 NOTE — Telephone Encounter (Signed)
Spoke with patient and informed her that results have not been looked at by provider yet. Patient verbalized understanding.

## 2019-02-01 ENCOUNTER — Encounter: Payer: Self-pay | Admitting: Physician Assistant

## 2019-02-01 DIAGNOSIS — N898 Other specified noninflammatory disorders of vagina: Secondary | ICD-10-CM

## 2019-02-01 MED ORDER — AMITRIPTYLINE HCL 25 MG PO TABS: 25.0000 mg | ORAL_TABLET | Freq: Every day | ORAL | 2 refills | Status: DC

## 2019-02-01 NOTE — Addendum Note (Signed)
Addended by: Karle Plumber B on: 02/01/2019 11:43 AM   Modules accepted: Orders

## 2019-02-01 NOTE — Telephone Encounter (Signed)
RF sent on Amitriptyline.

## 2019-02-10 LAB — CERVICOVAGINAL ANCILLARY ONLY
Bacterial Vaginitis (gardnerella): POSITIVE — AB
Candida Glabrata: NEGATIVE
Candida Vaginitis: NEGATIVE
Chlamydia: NEGATIVE
Comment: NEGATIVE
Comment: NEGATIVE
Comment: NEGATIVE
Comment: NEGATIVE
Comment: NEGATIVE
Comment: NORMAL
Neisseria Gonorrhea: NEGATIVE
Trichomonas: NEGATIVE

## 2019-02-13 ENCOUNTER — Other Ambulatory Visit: Payer: Self-pay | Admitting: Physician Assistant

## 2019-02-13 MED ORDER — METRONIDAZOLE 500 MG PO TABS: 500.0000 mg | ORAL_TABLET | Freq: Two times a day (BID) | ORAL | 0 refills | Status: DC

## 2019-03-02 ENCOUNTER — Telehealth: Payer: Self-pay | Admitting: Internal Medicine

## 2019-03-02 NOTE — Telephone Encounter (Signed)
Patient called wanting to speak to you in regards to the OC please follow up.

## 2019-03-02 NOTE — Telephone Encounter (Signed)
Pt was explain that the OC does not paid hospital bills is the [facility] discount Pt understood

## 2019-05-02 ENCOUNTER — Telehealth: Payer: Self-pay | Admitting: Urology

## 2019-05-02 NOTE — Telephone Encounter (Signed)
Pt requests refill for amitriptyline 25 mg to  [facility] [address] [place], [place]

## 2019-05-04 ENCOUNTER — Other Ambulatory Visit: Payer: Self-pay

## 2019-05-04 DIAGNOSIS — R102 Pelvic and perineal pain: Secondary | ICD-10-CM

## 2019-05-04 MED ORDER — AMITRIPTYLINE HCL 25 MG PO TABS: 25.0000 mg | ORAL_TABLET | Freq: Every day | ORAL | 3 refills | Status: DC

## 2019-05-04 NOTE — Telephone Encounter (Signed)
Prescription sent in for pt. Please let the patient know. Thanks.

## 2019-05-04 NOTE — Telephone Encounter (Signed)
I let pt know.

## 2019-05-19 ENCOUNTER — Ambulatory Visit: Payer: Self-pay | Admitting: Urology

## 2019-07-06 ENCOUNTER — Encounter: Payer: Self-pay | Admitting: Internal Medicine

## 2019-07-06 ENCOUNTER — Ambulatory Visit: Payer: Self-pay | Attending: Family | Admitting: Family

## 2019-07-06 ENCOUNTER — Other Ambulatory Visit: Payer: Self-pay

## 2019-07-06 DIAGNOSIS — B3731 Acute candidiasis of vulva and vagina: Secondary | ICD-10-CM

## 2019-07-06 DIAGNOSIS — K047 Periapical abscess without sinus: Secondary | ICD-10-CM

## 2019-07-06 DIAGNOSIS — B373 Candidiasis of vulva and vagina: Secondary | ICD-10-CM

## 2019-07-06 MED ORDER — FLUCONAZOLE 150 MG PO TABS: 150.0000 mg | ORAL_TABLET | Freq: Once | ORAL | 0 refills | Status: AC

## 2019-07-06 MED ORDER — AMOXICILLIN 500 MG PO CAPS: 500.0000 mg | ORAL_CAPSULE | Freq: Three times a day (TID) | ORAL | 0 refills | Status: DC

## 2019-07-06 NOTE — Progress Notes (Addendum)
Virtual Visit via Telephone Note  I connected with Alicia Cochran, on 07/06/2019 at 2:57 PM by telephone due to the COVID-19 pandemic and verified that I am speaking with the correct person using two identifiers.  Due to current restrictions/limitations of in-office visits due to the COVID-19 pandemic, this scheduled clinical appointment was converted to a telehealth visit.   Consent: I discussed the limitations, risks, security and privacy concerns of performing an evaluation and management service by telephone and the availability of in person appointments. I also discussed with the patient that there may be a patient responsible charge related to this service. The patient expressed understanding and agreed to proceed.   Location of Patient: Home  Location of Provider: [facility]   Persons participating in Telemedicine visit: Treasure Perez-Sanchez Kamaile Zachow Zonia Kief, NP Laruth Bouchard, CMA  History of Present Illness: Alicia Cochran is a 29 year old female with history of ovarian cyst, generalized anxiety disorder, and pelvic pain in female. Her concerns today are tooth infection and vaginal itching.   1. Tooth/Teeth & Oral Pain:  Onset: 1 week ago  Location: top right wisdom tooth Gum Pain: Yes [x]  No []  Tooth Pain: Yes []  No [x]  Pain Score: 7/10 Gum Swelling: Yes []  No []   Unable to see Gum Bleeding: Yes [x]  No []  Gum Redness: Yes []  No [] Unable to see  Tongue Pain: Yes []  No [x]   Tongue Swollen: Yes []  No [x]  Tonsil Swelling: Yes []  No [x]  Bad Breath: Yes [x]  No []   Pus Drainage: Yes [x]  Saw some yellow drainage but it wasn't that much Facial Pain: Yes []  No [x]  Jaw Pain: Yes []  No [x]  Neck Pain: Yes []  No [x]  Fever: Yes []  No [x]  Sore Throat: Yes []  No [x]  Cough: Yes []  No [x]  Shortness of Breath: Yes []  No [x]  Nausea: Yes []  No [x]  Vomiting: Yes []  No [x]  Chest Pain: Yes []  No [x]  Comments: On June 19, 2019 had wisdom tooth  removed at [facility]. Called the office several times over the last week and was able to speak with someone today who said antibiotics couldn't be prescribed because orange card is expired. Reports mouth has a bad smell. Doing salt water rinse and [employer] mouthwash without success. Recently got braces removed January.    2. VAGINAL ITCHING:  White vaginal discharge with persistent itching since Monday. Purchased probiotics 2 weeks ago but hasn't helped. History of frequent yeast infections in the past. Denies burning with urination, pain with intercourse, pelvic pain, and odor. Last menstrual period 2 weeks ago.    Past Medical History:  Diagnosis Date  . Cholestasis of pregnancy in third trimester   . Hx of chlamydia infection    No Known Allergies  Current Outpatient Medications on File Prior to Visit  Medication Sig Dispense Refill  . amitriptyline (ELAVIL) 25 MG tablet Take 1 tablet (25 mg total) by mouth at bedtime. 30 tablet 3  . metroNIDAZOLE (FLAGYL) 500 MG tablet Take 1 tablet (500 mg total) by mouth 2 (two) times daily. 14 tablet 0  . nystatin ointment (MYCOSTATIN) Apply 1 application topically 2 (two) times daily. As needed (Patient not taking: Reported on 01/26/2019) 30 g 1  . phenazopyridine (PYRIDIUM) 100 MG tablet Take 1 tablet (100 mg total) by mouth 3 (three) times daily as needed for pain. Or 1 daily for 7 days then as needed (Patient not taking: Reported on 01/26/2019) 30 tablet 0   No current facility-administered medications  on file prior to visit.    Observations/Objective: Alert and oriented x 3. Not in acute distress. Physical examination not completed as this is a telemedicine visit.  Assessment and Plan: 1. Tooth infection: - amoxicillin (AMOXIL) 500 MG capsule; Take 1 capsule (500 mg total) by mouth 3 (three) times daily.  Dispense: 21 capsule; Refill: 0 -Encouraged patient to reapply for [facility] discount/orange card, patient  agreeable. -Informed patient that if tooth infection persists she may need to be seen by dentistry again.  2. Vaginal yeast infection - fluconazole (DIFLUCAN) 150 MG tablet; Take 1 tablet (150 mg total) by mouth once for 1 dose.  Dispense: 1 tablet; Refill: 0 -Treating empirically for yeast infection   Follow Up Instructions: Patient was given clear instructions to go to Emergency Department or return to medical center if symptoms don't improve, worsen, or new problems develop.The patient verbalized understanding.  I discussed the assessment and treatment plan with the patient. The patient was provided an opportunity to ask questions and all were answered. The patient agreed with the plan and demonstrated an understanding of the instructions.   The patient was advised to call back or seek an in-person evaluation if the symptoms worsen or if the condition fails to improve as anticipated.  I provided 20 minutes total of non-face-to-face time during this encounter including median intraservice time, reviewing previous notes, labs, imaging, medications, management and patient verbalized understanding.   Camillia Herter, NP  [facility] and [facility] [place], [place]   07/06/2019, 2:57 PM

## 2019-07-06 NOTE — Patient Instructions (Signed)
Dental Abscess  A dental abscess is an area of pus in or around a tooth. It comes from an infection. It can cause pain and other symptoms. Treatment will help with symptoms and prevent the infection from spreading. Follow these instructions at home: Medicines  Take over-the-counter and prescription medicines only as told by your dentist.  If you were prescribed an antibiotic medicine, take it as told by your dentist. Do not stop taking it even if you start to feel better.  If you were prescribed a gel that has numbing medicine in it, use it exactly as told.  Do not drive or use heavy machinery (like a lawn mower) while taking prescription pain medicine. General instructions  Rinse out your mouth often with salt water. ? To make salt water, dissolve -1 tsp of salt in 1 cup of warm water.  Eat a soft diet while your mouth is healing.  Drink enough fluid to keep your urine pale yellow.  Do not apply heat to the outside of your mouth.  Do not use any products that contain nicotine or tobacco. These include cigarettes and e-cigarettes. If you need help quitting, ask your doctor.  Keep all follow-up visits as told by your dentist. This is important. Prevent an abscess  Brush your teeth every morning and every night. Use fluoride toothpaste.  Floss your teeth each day.  Get dental cleanings as often as told by your dentist.  Think about getting dental sealant put on teeth that have deep holes (decay).  Drink water that has fluoride in it. ? Most tap water has fluoride. ? Check the label on bottled water to see if it has fluoride in it.  Drink water instead of sugary drinks.  Eat healthy meals and snacks.  Wear a mouth guard or face shield when you play sports. Contact a doctor if:  Your pain is worse, and medicine does not help. Get help right away if:  You have a fever or chills.  Your symptoms suddenly get worse.  You have a very bad headache.  You have problems  breathing or swallowing.  You have trouble opening your mouth.  You have swelling in your neck or close to your eye. Summary  A dental abscess is an area of pus in or around a tooth. It is caused by an infection.  Treatment will help with symptoms and prevent the infection from spreading.  Take over-the-counter and prescription medicines only as told by your dentist.  To prevent an abscess, take good care of your teeth. Brush your teeth every morning and night. Use floss every day.  Get dental cleanings as often as told by your dentist. This information is not intended to replace advice given to you by your health care provider. Make sure you discuss any questions you have with your health care provider. Document Revised: 08/03/2018 Document Reviewed: 12/14/2016 Elsevier Patient Education  2020 Elsevier Inc.  

## 2019-07-10 ENCOUNTER — Ambulatory Visit: Payer: Self-pay

## 2019-07-26 ENCOUNTER — Ambulatory Visit: Payer: Self-pay

## 2019-07-27 ENCOUNTER — Encounter: Payer: Self-pay | Admitting: Internal Medicine

## 2019-07-27 NOTE — Telephone Encounter (Signed)
Please call pt back after speaking with [place]

## 2019-07-27 NOTE — Telephone Encounter (Signed)
Attempt to reach patient back to inform on Provider Zonia Kief.  Pt. Had a antibiotic for yeast infection on 03.11.21. Pt. Would need to be come in for a cytology for the yeast infection.    No answer and LVM for patient to call back .

## 2019-08-07 ENCOUNTER — Ambulatory Visit: Payer: Self-pay

## 2019-08-08 IMAGING — CT CT ABD-PELV W/ CM
2 of 4 series · 16 of 46 positions shown, 18 images · IV contrast (iopamidol)
Comparison: Abdominal CT 11/17/2016

CLINICAL DATA: Right lower quadrant abdominal pain.

EXAM:
CT ABDOMEN AND PELVIS WITH CONTRAST
TECHNIQUE: Multidetector CT imaging of the abdomen and pelvis was performed
using the standard protocol following bolus administration of
intravenous contrast.
CONTRAST:  100mL 13IALZ-H44 IOPAMIDOL (13IALZ-H44) INJECTION 61%

[Series 2: axial st · axial · 0.61mm/px · z∈[-94,+241]mm · 13 of 77 slices shown, 15 images]
[im 5/77  soft-tissue]
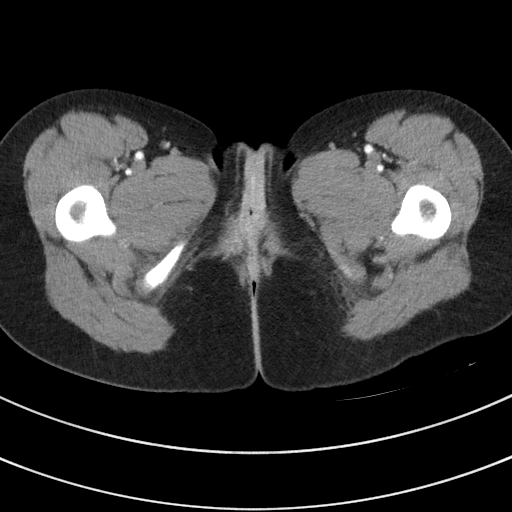
[im 5/77  bone]
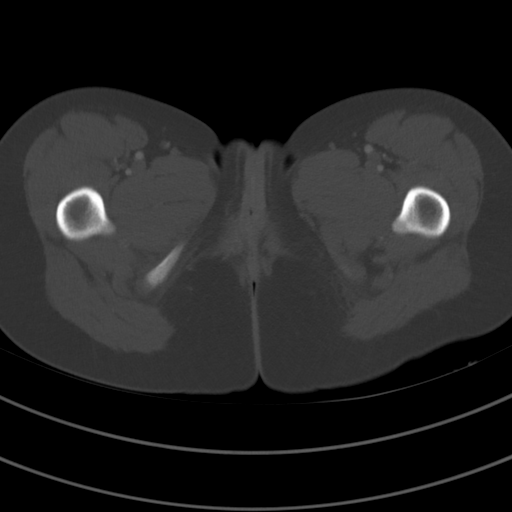
[im 9/77  soft-tissue]
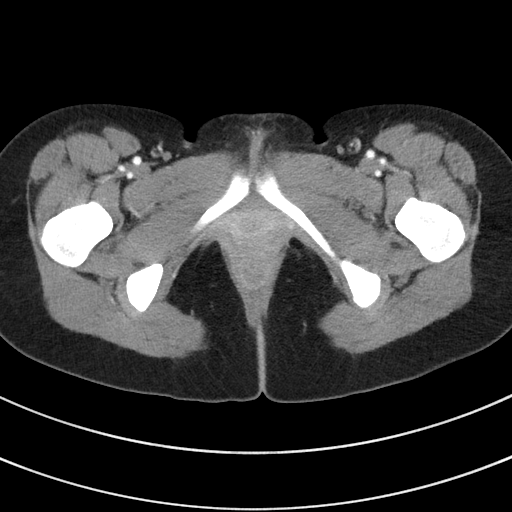
[im 18/77  soft-tissue]
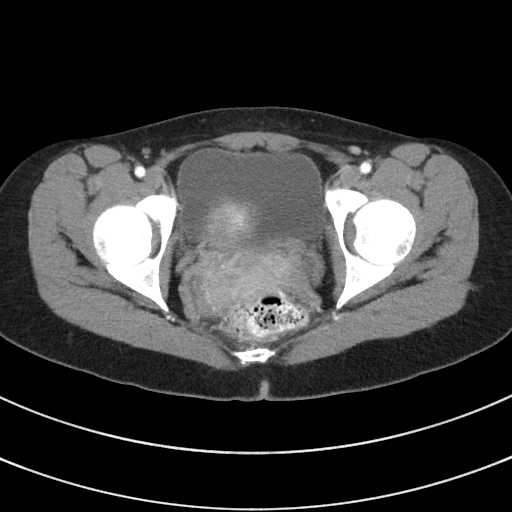
[im 23/77  soft-tissue]
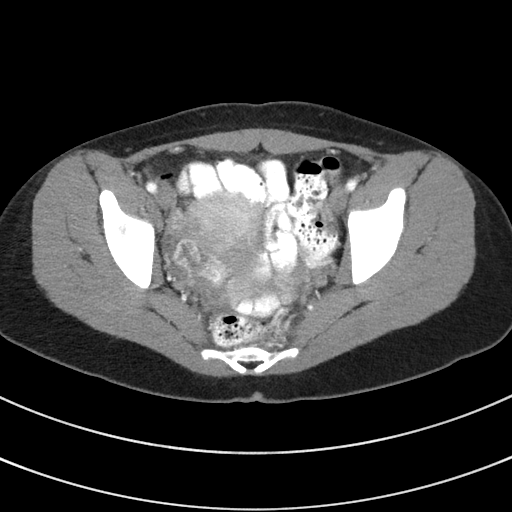
[im 27/77  soft-tissue]
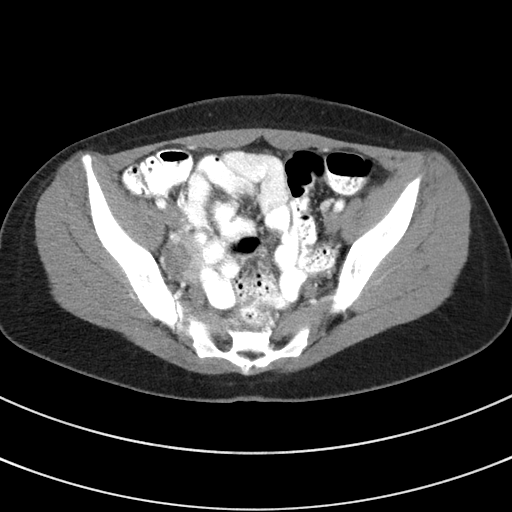
[im 32/77  soft-tissue]
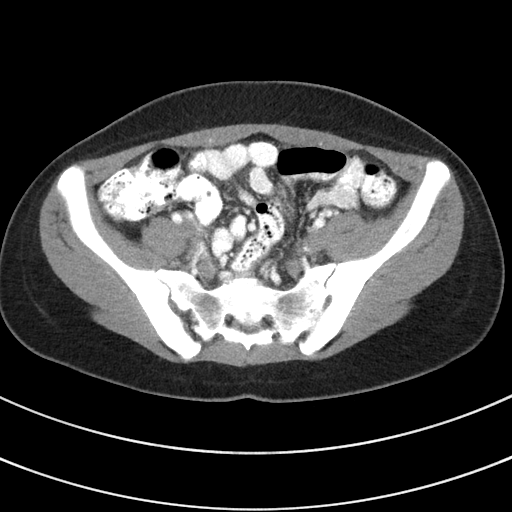
[im 41/77  soft-tissue]
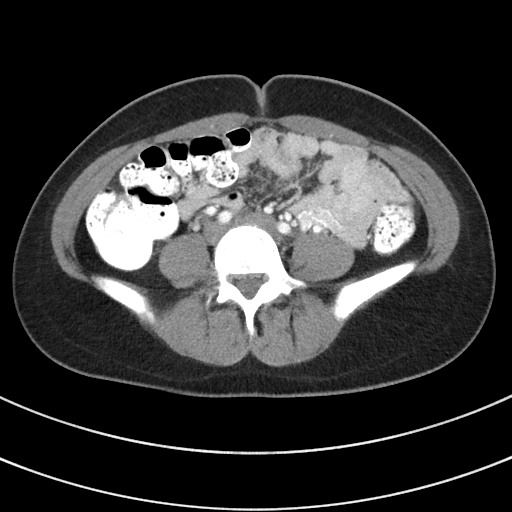
[im 45/77  soft-tissue]
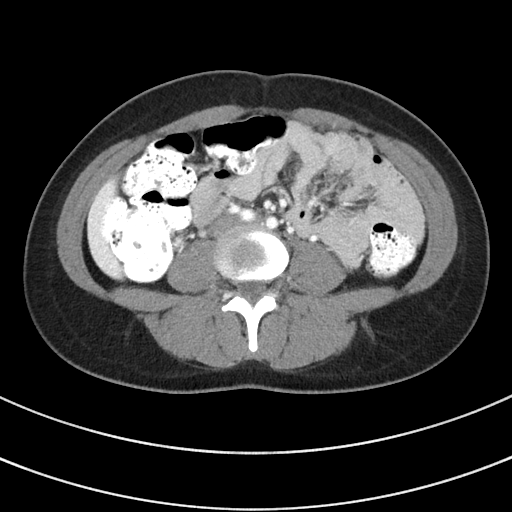
[im 50/77  soft-tissue]
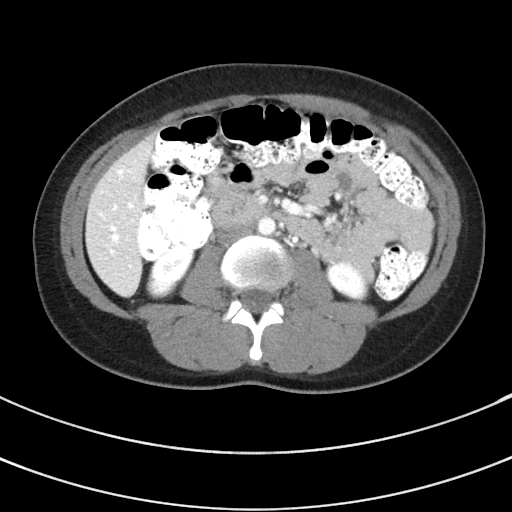
[im 50/77  bone]
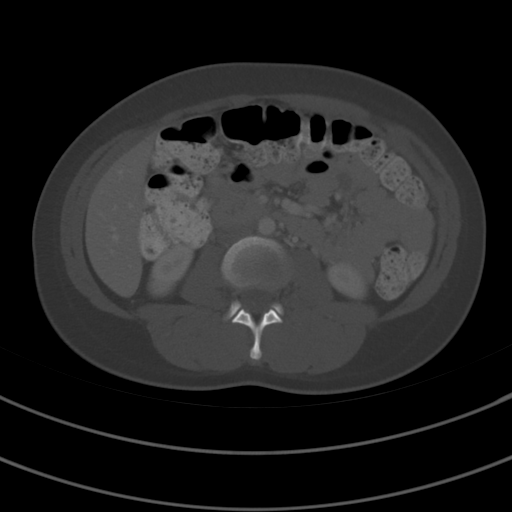
[im 54/77  soft-tissue]
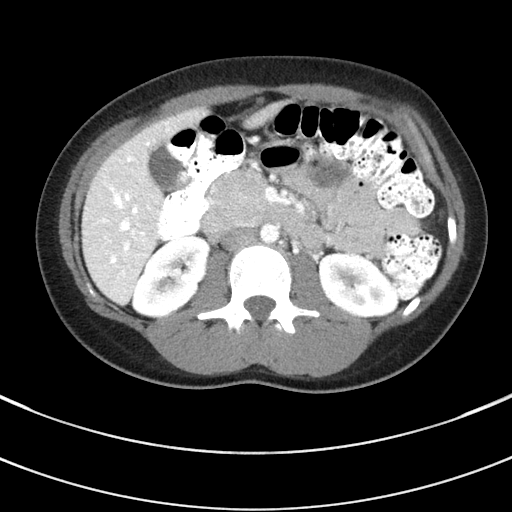
[im 59/77  soft-tissue]
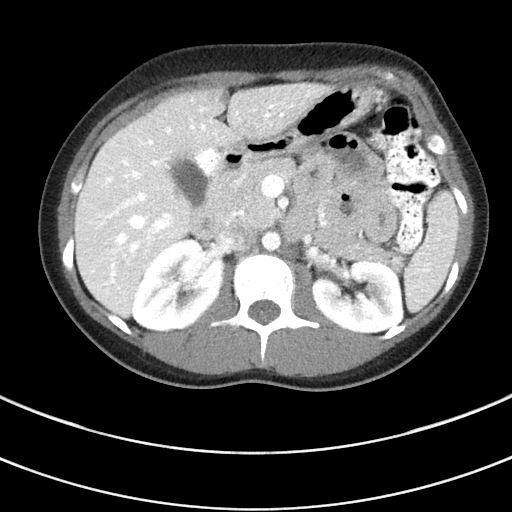
[im 68/77  soft-tissue]
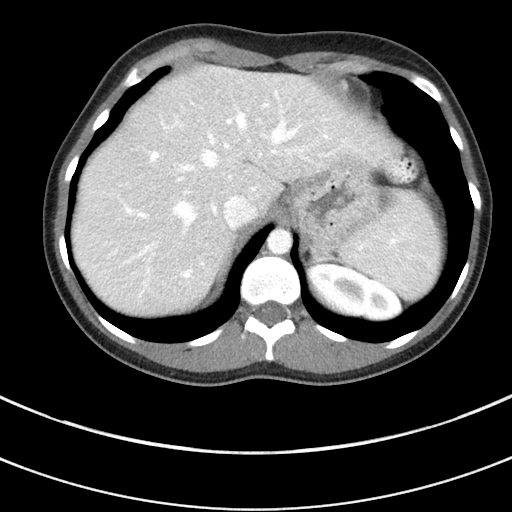
[im 72/77  soft-tissue]
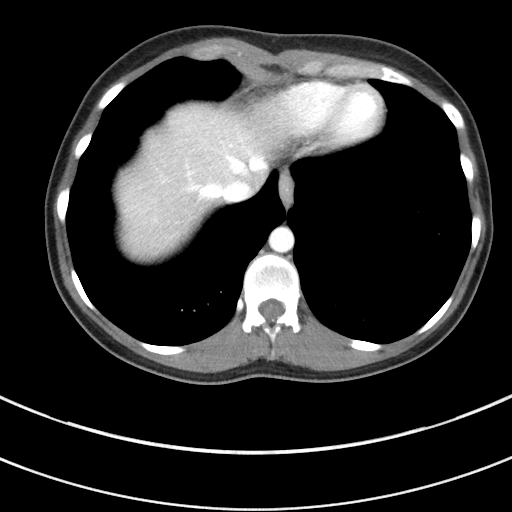

[Series 5: coronal st · coronal · 0.65mm/px · 3 of 88 slices shown]
[im 30/88  soft-tissue]
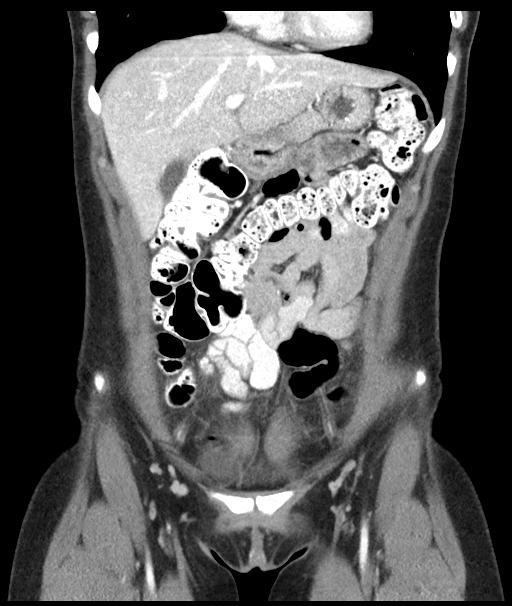
[im 39/88  soft-tissue]
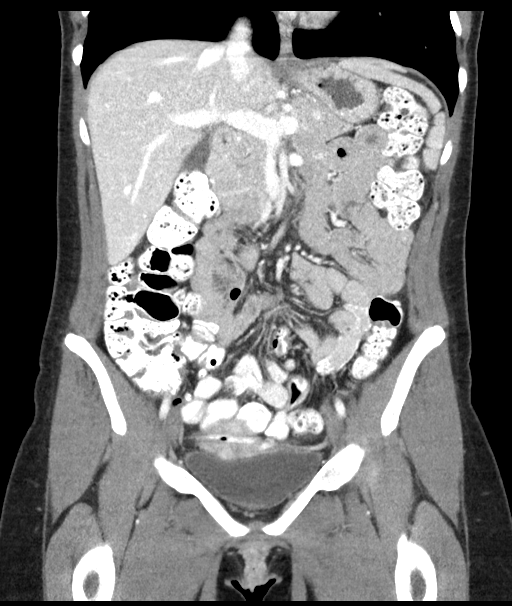
[im 49/88  soft-tissue]
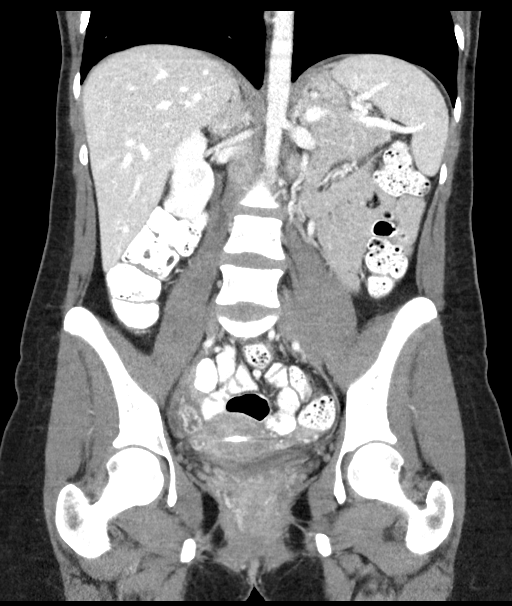

[16 of 46 positions shown; findings below may reference images not displayed]

FINDINGS: Lower chest: No acute abnormality.

Hepatobiliary: No focal liver abnormality is seen. No gallstones,
gallbladder wall thickening, or biliary dilatation.

Pancreas: Unremarkable. No pancreatic ductal dilatation or
surrounding inflammatory changes.

Spleen: Normal in size without focal abnormality.

Adrenals/Urinary Tract: Adrenal glands are unremarkable. Kidneys are
normal, without renal calculi, focal lesion, or hydronephrosis.
Bladder is unremarkable.

Stomach/Bowel: Stomach is within normal limits. Appendix appears
normal. No evidence of bowel wall thickening, distention, or
inflammatory changes.

Vascular/Lymphatic: No significant vascular findings are present. No
enlarged abdominal or pelvic lymph nodes.

Reproductive: Uterus and bilateral adnexa demonstrate no acute
abnormalities. IUD in the uterus. 2.1 cm probable corpus luteal cyst
on the right ovary.

Other: No abdominal wall hernia or abnormality. No abdominopelvic
ascites.

Musculoskeletal: No acute or significant osseous findings.
IMPRESSION: No evidence of acute abnormalities within the abdomen or pelvis.

2.1 cm probable corpus luteal cyst on the right ovary.

## 2019-08-25 ENCOUNTER — Telehealth: Payer: Self-pay | Admitting: Urology

## 2019-08-25 ENCOUNTER — Other Ambulatory Visit: Payer: Self-pay | Admitting: Urology

## 2019-08-25 DIAGNOSIS — R102 Pelvic and perineal pain: Secondary | ICD-10-CM

## 2019-08-25 MED ORDER — AMITRIPTYLINE HCL 25 MG PO TABS: 25.0000 mg | ORAL_TABLET | Freq: Every day | ORAL | 3 refills | Status: DC

## 2019-08-25 NOTE — Telephone Encounter (Signed)
Pt called for a rx refill. States she only has 2 pills left.

## 2019-08-25 NOTE — Telephone Encounter (Signed)
I have sent the refill.   When you send refill requests, please always note the medication with dose and amount they are requesting and the pharmacy they want it sent to.   Some folks have multiple meds we have given and multiple pharmacies.  Some of these meds might be in Urochart and not yet in Epic and it is better to ask the patient for the details instead of having to dig for the information in a couple of EMR's.   Thanks.

## 2019-09-13 ENCOUNTER — Ambulatory Visit: Payer: Self-pay

## 2019-12-20 ENCOUNTER — Telehealth: Payer: Self-pay

## 2019-12-20 NOTE — Telephone Encounter (Signed)
Pt called requesting refill of amitriptyline. Pt informed that she will need to be seen by Dr before refilling rx due to pt not being seen in over 2 yrs. Pt has received 2 refills in the past and  no showed her last 2 appointments. Pt stated that she is fine and does not need to see the Dr.

## 2021-12-30 ENCOUNTER — Ambulatory Visit: Payer: Self-pay | Admitting: Physician Assistant

## 2021-12-30 ENCOUNTER — Encounter: Payer: Self-pay | Admitting: Physician Assistant

## 2021-12-30 ENCOUNTER — Ambulatory Visit: Payer: Self-pay | Attending: Internal Medicine

## 2021-12-30 ENCOUNTER — Other Ambulatory Visit: Payer: Self-pay

## 2021-12-30 VITALS — BP 118/77 | HR 63 | Ht 59.0 in | Wt 112.0 lb

## 2021-12-30 DIAGNOSIS — R140 Abdominal distension (gaseous): Secondary | ICD-10-CM

## 2021-12-30 DIAGNOSIS — A048 Other specified bacterial intestinal infections: Secondary | ICD-10-CM

## 2021-12-30 DIAGNOSIS — R1012 Left upper quadrant pain: Secondary | ICD-10-CM

## 2021-12-30 MED ORDER — OMEPRAZOLE 20 MG PO CPDR
20.0000 mg | DELAYED_RELEASE_CAPSULE | Freq: Every day | ORAL | 3 refills | Status: DC
  Filled 2021-12-30: qty 30, 30d supply, fill #0

## 2021-12-30 NOTE — Progress Notes (Signed)
Established Patient Office Visit  Subjective   Patient ID: Alicia Cochran, female    DOB: October 15, 1990  Age: 31 y.o. MRN: [id number]  Chief Complaint  Patient presents with   Abdominal Pain    States that she has been having left upper quadrant abdominal pain since June.  States that she will start having pain approximately 5 minutes after eating, states it does not matter what she eats.  States that it feels like bloating, pressure, states that she has been having more gas than normal.  States that she feels better after having a bowel movement, but states that this generally does not occur until the morning, states bowel movements are normal soft formed.  Denies episodes of diarrhea.  States that the pain is waking her up most nights at 3 AM and finds it difficult to go back to sleep.  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  States that she drinks approximately 64 ounces of water a day, does drink a small amount of coffee in the morning.  States that other than waking up at 3 AM and having difficulty falling back asleep due to the abdominal pain, she is able to sleep well.  Is sleeping approximately 6 to 8 hours a night.  States that she feels her stress level is well managed.  Denies any previous known food allergies     Past Medical History:  Diagnosis Date   Cholestasis of pregnancy in third trimester    Hx of chlamydia infection    Social History   Socioeconomic History   Marital status: Single    Spouse name: Not on file   Number of children: Not on file   Years of education: Not on file   Highest education level: Not on file  Occupational History   Not on file  Tobacco Use   Smoking status: Never   Smokeless tobacco: Never  Vaping Use   Vaping Use: Never used  Substance and Sexual Activity   Alcohol use: No    Alcohol/week: 0.0 standard drinks of alcohol   Drug use: No   Sexual activity: Yes  Other Topics Concern   Not on file  Social  History Narrative   ** Merged History Encounter **       Social Determinants of Health   Financial Resource Strain: Not on file  Food Insecurity: Not on file  Transportation Needs: Not on file  Physical Activity: Not on file  Stress: Not on file  Social Connections: Not on file  Intimate Partner Violence: Not on file   Family History  Problem Relation Age of Onset   Asthma Mother    Diabetes Maternal Grandmother    Alcohol abuse Neg Hx    Arthritis Neg Hx    Birth defects Neg Hx    Cancer Neg Hx    COPD Neg Hx    Depression Neg Hx    Drug abuse Neg Hx    Early death Neg Hx    Hearing loss Neg Hx    Heart disease Neg Hx    Hyperlipidemia Neg Hx    Hypertension Neg Hx    Kidney disease Neg Hx    Learning disabilities Neg Hx    Mental illness Neg Hx    Mental retardation Neg Hx    Miscarriages / Stillbirths Neg Hx    Stroke Neg Hx    Vision loss Neg Hx    Varicose Veins Neg Hx    No Known  Allergies  Review of Systems  Constitutional:  Negative for chills and fever.  HENT: Negative.    Eyes: Negative.   Respiratory:  Negative for shortness of breath.   Cardiovascular:  Negative for chest pain.  Gastrointestinal:  Positive for abdominal pain. Negative for diarrhea, heartburn, nausea and vomiting.  Genitourinary:  Negative for dysuria and urgency.  Musculoskeletal:  Negative for back pain.  Skin: Negative.   Neurological:  Negative for headaches.  Endo/Heme/Allergies: Negative.   Psychiatric/Behavioral:  Negative for depression. The patient is not nervous/anxious and does not have insomnia.       Objective:     BP 118/77 (BP Location: Right Arm)   Pulse 63   Ht 4\' 11"  (1.499 m)   Wt 112 lb (50.8 kg)   LMP 12/29/2021 (Exact Date)   BMI 22.62 kg/m    Physical Exam Vitals reviewed.  Constitutional:      Appearance: Normal appearance.  HENT:     Head: Normocephalic and atraumatic.     Right Ear: External ear normal.     Left Ear: External ear normal.      Nose: Nose normal.     Mouth/Throat:     Mouth: Mucous membranes are moist.     Pharynx: Oropharynx is clear.  Eyes:     Extraocular Movements: Extraocular movements intact.     Conjunctiva/sclera: Conjunctivae normal.     Pupils: Pupils are equal, round, and reactive to light.  Cardiovascular:     Rate and Rhythm: Normal rate and regular rhythm.     Pulses: Normal pulses.     Heart sounds: Normal heart sounds.  Pulmonary:     Effort: Pulmonary effort is normal.     Breath sounds: Normal breath sounds.  Abdominal:     Comments: Exam performed in screening van, unable to do full abdominal exam  Musculoskeletal:        General: Normal range of motion.     Cervical back: Normal range of motion and neck supple.  Skin:    General: Skin is warm and dry.  Neurological:     General: No focal deficit present.     Mental Status: She is alert and oriented to person, place, and time.  Psychiatric:        Mood and Affect: Mood normal.        Thought Content: Thought content normal.        Judgment: Judgment normal.       Assessment & Plan:   Problem List Items Addressed This Visit   None Visit Diagnoses     Left upper quadrant abdominal pain    -  Primary   Relevant Medications   omeprazole (PRILOSEC) 20 MG capsule   Other Relevant Orders   CBC with Differential/Platelet   Comp. Metabolic Panel (12)   H Pylori, IGM, IGG, IGA AB   Bloating       Relevant Medications   omeprazole (PRILOSEC) 20 MG capsule   Other Relevant Orders   CBC with Differential/Platelet   Comp. Metabolic Panel (12)   H Pylori, IGM, IGG, IGA AB   Gassiness         1. Left upper quadrant abdominal pain Trial Prilosec, patient education given on supportive care.  Red flags given for prompt reevaluation.  Patient given [facility], mobile team working out of screening 02/28/2022 today unable to perform labs. - omeprazole (PRILOSEC) 20 MG  capsule; Take 1  capsule (20 mg total) by mouth daily.  Dispense: 30 capsule; Refill: 3 - CBC with Differential/Platelet; Future - Comp. Metabolic Panel (12); Future - H Pylori, IGM, IGG, IGA AB; Future  2. Bloating  - omeprazole (PRILOSEC) 20 MG capsule; Take 1 capsule (20 mg total) by mouth daily.  Dispense: 30 capsule; Refill: 3 - CBC with Differential/Platelet; Future - Comp. Metabolic Panel (12); Future - H Pylori, IGM, IGG, IGA AB; Future  3. Gassiness    I have reviewed the patient's medical history (PMH, PSH, Social History, Family History, Medications, and allergies) , and have been updated if relevant. I spent 30 minutes reviewing chart and  face to face time with patient.     Return if symptoms worsen or fail to improve.    Loraine Grip Mayers, PA-C

## 2021-12-30 NOTE — Patient Instructions (Signed)
You are going to take omeprazole 20mg  once daily for at least 3 weeks I encourage you to stop gluten for one week to see if this offers improvement  We will call you with your lab results  , PA-C Physician Assistant [facility] Medicine [facility]  Abdominal Pain, Adult Pain in the abdomen (abdominal pain) can be caused by many things. Often, abdominal pain is not serious and it gets better with no treatment or by being treated at home. However, sometimes abdominal pain is serious. Your health care provider will ask questions about your medical history and do a physical exam to try to determine the cause of your abdominal pain. Follow these instructions at home: Medicines Take over-the-counter and prescription medicines only as told by your health care provider. Do not take a laxative unless told by your health care provider. General instructions  Watch your condition for any changes. Drink enough fluid to keep your urine pale yellow. Keep all follow-up visits as told by your health care provider. This is important. Contact a health care provider if: Your abdominal pain changes or gets worse. You are not hungry or you lose weight without trying. You are constipated or have diarrhea for more than 2-3 days. You have pain when you urinate or have a bowel movement. Your abdominal pain wakes you up at night. Your pain gets worse with meals, after eating, or with certain foods. You are vomiting and cannot keep anything down. You have a fever. You have blood in your urine. Get help right away if: Your pain does not go away as soon as your health care provider told you to expect. You cannot stop vomiting. Your pain is only in areas of the abdomen, such as the right side or the left lower portion of the abdomen. Pain on the right side could be caused by appendicitis. You have bloody or black stools, or stools that look  like tar. You have severe pain, cramping, or bloating in your abdomen. You have signs of dehydration, such as: Dark urine, very little urine, or no urine. Cracked lips. Dry mouth. Sunken eyes. Sleepiness. Weakness. You have trouble breathing or chest pain. Summary Often, abdominal pain is not serious and it gets better with no treatment or by being treated at home. However, sometimes abdominal pain is serious. Watch your condition for any changes. Take over-the-counter and prescription medicines only as told by your health care provider. Contact a health care provider if your abdominal pain changes or gets worse. Get help right away if you have severe pain, cramping, or bloating in your abdomen. This information is not intended to replace advice given to you by your health care provider. Make sure you discuss any questions you have with your health care provider. Document Revised: 06/02/2019 Document Reviewed: 08/22/2018 Elsevier Patient Education  2023 2024.

## 2021-12-31 ENCOUNTER — Other Ambulatory Visit: Payer: Self-pay

## 2021-12-31 MED ORDER — CLARITHROMYCIN 500 MG PO TABS
500.0000 mg | ORAL_TABLET | Freq: Two times a day (BID) | ORAL | 0 refills | Status: AC
  Filled 2021-12-31: qty 28, 14d supply, fill #0

## 2021-12-31 MED ORDER — AMOXICILLIN 500 MG PO CAPS
1000.0000 mg | ORAL_CAPSULE | Freq: Two times a day (BID) | ORAL | 0 refills | Status: AC
  Filled 2021-12-31: qty 56, 14d supply, fill #0

## 2021-12-31 MED ORDER — OMEPRAZOLE 40 MG PO CPDR
40.0000 mg | DELAYED_RELEASE_CAPSULE | Freq: Every day | ORAL | 1 refills | Status: DC
  Filled 2021-12-31: qty 30, 30d supply, fill #0
  Filled 2022-01-26 (×2): qty 30, 30d supply, fill #1

## 2021-12-31 NOTE — Addendum Note (Signed)
Addended by: Roney Jaffe on: 12/31/2021 05:17 PM   Modules accepted: Orders

## 2022-01-01 ENCOUNTER — Other Ambulatory Visit: Payer: Self-pay

## 2022-01-01 ENCOUNTER — Telehealth: Payer: Self-pay

## 2022-01-01 LAB — CBC WITH DIFFERENTIAL/PLATELET
Basophils Absolute: 0 10*3/uL (ref 0.0–0.2)
Basos: 1 %
EOS (ABSOLUTE): 0 10*3/uL (ref 0.0–0.4)
Eos: 1 %
Hematocrit: 38.9 % (ref 34.0–46.6)
Hemoglobin: 13.6 g/dL (ref 11.1–15.9)
Immature Grans (Abs): 0 10*3/uL (ref 0.0–0.1)
Immature Granulocytes: 0 %
Lymphocytes Absolute: 1.5 10*3/uL (ref 0.7–3.1)
Lymphs: 22 %
MCH: 32.6 pg (ref 26.6–33.0)
MCHC: 35 g/dL (ref 31.5–35.7)
MCV: 93 fL (ref 79–97)
Monocytes Absolute: 0.3 10*3/uL (ref 0.1–0.9)
Monocytes: 5 %
Neutrophils Absolute: 4.7 10*3/uL (ref 1.4–7.0)
Neutrophils: 71 %
Platelets: 209 10*3/uL (ref 150–450)
RBC: 4.17 x10E6/uL (ref 3.77–5.28)
RDW: 12.8 % (ref 11.7–15.4)
WBC: 6.6 10*3/uL (ref 3.4–10.8)

## 2022-01-01 LAB — COMP. METABOLIC PANEL (12)
AST: 16 IU/L (ref 0–40)
Albumin/Globulin Ratio: 2 (ref 1.2–2.2)
Albumin: 4.3 g/dL (ref 3.9–4.9)
Alkaline Phosphatase: 48 IU/L (ref 44–121)
BUN/Creatinine Ratio: 19 (ref 9–23)
BUN: 14 mg/dL (ref 6–20)
Bilirubin Total: 0.2 mg/dL (ref 0.0–1.2)
Calcium: 9.3 mg/dL (ref 8.7–10.2)
Chloride: 105 mmol/L (ref 96–106)
Creatinine, Ser: 0.72 mg/dL (ref 0.57–1.00)
Globulin, Total: 2.1 g/dL (ref 1.5–4.5)
Glucose: 79 mg/dL (ref 70–99)
Potassium: 4.1 mmol/L (ref 3.5–5.2)
Sodium: 141 mmol/L (ref 134–144)
Total Protein: 6.4 g/dL (ref 6.0–8.5)
eGFR: 115 mL/min/{1.73_m2} (ref >59)

## 2022-01-01 LAB — H PYLORI, IGM, IGG, IGA AB
H pylori, IgM Abs: <9 units (ref 0.0–8.9)
H. pylori, IgA Abs: 20.4 units — ABNORMAL HIGH (ref 0.0–8.9)
H. pylori, IgG AbS: 1.61 Index Value — ABNORMAL HIGH (ref 0.00–0.79)

## 2022-01-01 NOTE — Telephone Encounter (Signed)
Pt. Given lab results and instructions, verbalizes understanding. 

## 2022-01-08 ENCOUNTER — Ambulatory Visit: Payer: Self-pay

## 2022-01-08 NOTE — Telephone Encounter (Signed)
Reason for Disposition  SEVERE rectal bleeding (large blood clots; constant or on and off bleeding)  Answer Assessment - Initial Assessment Questions 1. APPEARANCE of BLOOD: "What color is it?" "Is it passed separately, on the surface of the stool, or mixed in with the stool?"      Just blood - not clots 2. AMOUNT: "How much blood was passed?"      1 cup 3. FREQUENCY: "How many times has blood been passed with the stools?"      1 times 4. ONSET: "When was the blood first seen in the stools?" (Days or weeks)      today 5. DIARRHEA: "Is there also some diarrhea?" If Yes, ask: "How many diarrhea stools in the past 24 hours?"      no 6. CONSTIPATION: "Do you have constipation?" If Yes, ask: "How bad is it?"     no 7. RECURRENT SYMPTOMS: "Have you had blood in your stools before?" If Yes, ask: "When was the last time?" and "What happened that time?"      no 8. BLOOD THINNERS: "Do you take any blood thinners?" (e.g., Coumadin/warfarin, Pradaxa/dabigatran, aspirin)     Yes - went away on it's own. Went away on it's on 9. OTHER SYMPTOMS: "Do you have any other symptoms?"  (e.g., abdomen pain, vomiting, dizziness, fever)     Anxious, stressed out 10. PREGNANCY: "Is there any chance you are pregnant?" "When was your last menstrual period?"  Protocols used: Rectal Bleeding-A-AH

## 2022-01-08 NOTE — Telephone Encounter (Signed)
  Chief Complaint: Rectal bleeding Symptoms: ibid Frequency: today Pertinent Negatives: Patient denies dizziness. Disposition: [x] ED /[] Urgent Care (no appt availability in office) / [] Appointment(In office/virtual)/ []  [place] [facility]/ [] Home Care/ [] Refused Recommended Disposition /[]  Mobile Bus/ []  Follow-up with PCP Additional Notes: Pt states she has lost a good amount of blood from her rectum. Pt is unsure of the amount. PT states that she felt the need to have a BM and went she went it was straight blood. Pt does not feel dizzy. She has taken antibiotic s for a few days.

## 2022-01-09 NOTE — Telephone Encounter (Signed)
Pt states that she has not had any bleeding today and that she will go to mobile unit on Tuesday to be checked out

## 2022-01-13 ENCOUNTER — Encounter: Payer: Self-pay | Admitting: Physician Assistant

## 2022-01-13 ENCOUNTER — Telehealth: Payer: Self-pay

## 2022-01-13 ENCOUNTER — Ambulatory Visit: Payer: Self-pay | Admitting: Physician Assistant

## 2022-01-13 VITALS — BP 106/75 | Ht 59.0 in | Wt 112.0 lb

## 2022-01-13 DIAGNOSIS — F411 Generalized anxiety disorder: Secondary | ICD-10-CM

## 2022-01-13 DIAGNOSIS — A048 Other specified bacterial intestinal infections: Secondary | ICD-10-CM

## 2022-01-13 DIAGNOSIS — R1012 Left upper quadrant pain: Secondary | ICD-10-CM

## 2022-01-13 MED ORDER — SERTRALINE HCL 25 MG PO TABS: 25.0000 mg | ORAL_TABLET | Freq: Every day | ORAL | 1 refills | Status: DC

## 2022-01-13 MED ORDER — HYDROXYZINE HCL 25 MG PO TABS: ORAL_TABLET | ORAL | 0 refills | Status: DC

## 2022-01-13 NOTE — Patient Instructions (Signed)
You are going to start zoloft on a daily basis, I encourage you to start using the hydroxyzine at bedtime to help with sleep.  We will call you with your Ultrasound appt  Please feel free to follow up with the mobile team in one month for further evaluation of anxiety or you can follow up with your PCP.  Kennieth Rad, PA-C Physician Assistant [facility] [email or web address]   Managing Anxiety, Adult After being diagnosed with anxiety, you may be relieved to know why you have felt or behaved a certain way. You may also feel overwhelmed about the treatment ahead and what it will mean for your life. With care and support, you can manage this condition. How to manage lifestyle changes Managing stress and anxiety  Stress is your body's reaction to life changes and events, both good and bad. Most stress will last just a few hours, but stress can be ongoing and can lead to more than just stress. Although stress can play a major role in anxiety, it is not the same as anxiety. Stress is usually caused by something external, such as a deadline, test, or competition. Stress normally passes after the triggering event has ended.  Anxiety is caused by something internal, such as imagining a terrible outcome or worrying that something will go wrong that will devastate you. Anxiety often does not go away even after the triggering event is over, and it can become long-term (chronic) worry. It is important to understand the differences between stress and anxiety and to manage your stress effectively so that it does not lead to an anxious response. Talk with your health care provider or a counselor to learn more about reducing anxiety and stress. He or she may suggest tension reduction techniques, such as: Music therapy. Spend time creating or listening to music that you enjoy and that inspires you. Mindfulness-based meditation. Practice being aware of your  normal breaths while not trying to control your breathing. It can be done while sitting or walking. Centering prayer. This involves focusing on a word, phrase, or sacred image that means something to you and brings you peace. Deep breathing. To do this, expand your stomach and inhale slowly through your nose. Hold your breath for 3-5 seconds. Then exhale slowly, letting your stomach muscles relax. Self-talk. Learn to notice and identify thought patterns that lead to anxiety reactions and change those patterns to thoughts that feel peaceful. Muscle relaxation. Taking time to tense muscles and then relax them. Choose a tension reduction technique that fits your lifestyle and personality. These techniques take time and practice. Set aside 5-15 minutes a day to do them. Therapists can offer counseling and training in these techniques. The training to help with anxiety may be covered by some insurance plans. Other things you can do to manage stress and anxiety include: Keeping a stress diary. This can help you learn what triggers your reaction and then learn ways to manage your response. Thinking about how you react to certain situations. You may not be able to control everything, but you can control your response. Making time for activities that help you relax and not feeling guilty about spending your time in this way. Doing visual imagery. This involves imagining or creating mental pictures to help you relax. Practicing yoga. Through yoga poses, you can lower tension and promote relaxation.  Medicines Medicines can help ease symptoms. Medicines for anxiety include: Antidepressant medicines. These are usually prescribed for long-term daily control. Anti-anxiety medicines. These may  be added in severe cases, especially when panic attacks occur. Medicines will be prescribed by a health care provider. When used together, medicines, psychotherapy, and tension reduction techniques may be the most effective  treatment. Relationships Relationships can play a big part in helping you recover. Try to spend more time connecting with trusted friends and family members. Consider going to couples counseling if you have a partner, taking family education classes, or going to family therapy. Therapy can help you and others better understand your condition. How to recognize changes in your anxiety Everyone responds differently to treatment for anxiety. Recovery from anxiety happens when symptoms decrease and stop interfering with your daily activities at home or work. This may mean that you will start to: Have better concentration and focus. Worry will interfere less in your daily thinking. Sleep better. Be less irritable. Have more energy. Have improved memory. It is also important to recognize when your condition is getting worse. Contact your health care provider if your symptoms interfere with home or work and you feel like your condition is not improving. Follow these instructions at home: Activity Exercise. Adults should do the following: Exercise for at least 150 minutes each week. The exercise should increase your heart rate and make you sweat (moderate-intensity exercise). Strengthening exercises at least twice a week. Get the right amount and quality of sleep. Most adults need 7-9 hours of sleep each night. Lifestyle  Eat a healthy diet that includes plenty of vegetables, fruits, whole grains, low-fat dairy products, and lean protein. Do not eat a lot of foods that are high in fats, added sugars, or salt (sodium). Make choices that simplify your life. Do not use any products that contain nicotine or tobacco. These products include cigarettes, chewing tobacco, and vaping devices, such as e-cigarettes. If you need help quitting, ask your health care provider. Avoid caffeine, alcohol, and certain over-the-counter cold medicines. These may make you feel worse. Ask your pharmacist which medicines to  avoid. General instructions Take over-the-counter and prescription medicines only as told by your health care provider. Keep all follow-up visits. This is important. Where to find support You can get help and support from these sources: Self-help groups. Online and [employer]. A trusted spiritual leader. Couples counseling. Family education classes. Family therapy. Where to find more information You may find that joining a support group helps you deal with your anxiety. The following sources can help you locate counselors or support groups near you: Mental Health America: www.mentalhealthamerica.net Anxiety and Depression Association of [place] (ADAA): ProgramCam.de [employer] on Mental Illness (NAMI): www.nami.org Contact a health care provider if: You have a hard time staying focused or finishing daily tasks. You spend many hours a day feeling worried about everyday life. You become exhausted by worry. You start to have headaches or frequently feel tense. You develop chronic nausea or diarrhea. Get help right away if: You have a racing heart and shortness of breath. You have thoughts of hurting yourself or others. If you ever feel like you may hurt yourself or others, or have thoughts about taking your own life, get help right away. Go to your nearest emergency department or: Call your local emergency services (911 in the [place] Call a suicide crisis helpline, such as the National Suicide Prevention Lifeline at 272-798-7035 or 988 in the [place] This is open 24 hours a day in the [place] Text the Crisis Text Line at 425-649-8757 (in the [place] Summary Taking steps to learn and use tension reduction techniques  can help calm you and help prevent triggering an anxiety reaction. When used together, medicines, psychotherapy, and tension reduction techniques may be the most effective treatment. Family, friends, and partners can play a big part in supporting you. This  information is not intended to replace advice given to you by your health care provider. Make sure you discuss any questions you have with your health care provider. Document Revised: 11/06/2020 Document Reviewed: 08/04/2020 Elsevier Patient Education  2023 [employer].

## 2022-01-13 NOTE — Telephone Encounter (Signed)
Patient Given information for scheduled Abdomen US, at [facility] long center at 8:45 on 9.22.23. Patient expressed verbal understanding to not eat or drink anything after 12 am the night before.

## 2022-01-13 NOTE — Progress Notes (Signed)
Established Patient Office Visit  Subjective   Patient ID: Alicia Cochran, female    DOB: Jun 26, 1990  Age: 31 y.o. MRN: [id number]  Chief Complaint  Patient presents with   Abdominal Pain    LUQ Reoccurring    Rectal Bleeding    4 days after starting medication     States that she continues to have pain in her LUQ , states that she did start the antibiotic for h pylori treatment and is taking the prilosec 40 mg once daily.  States that she is eating and  drinking okay, continues to have daily NSF bowel movements.  Does endorse occasionally having to strain to go, states that she did have an episode of BRB last week, states that she thought she needed to have a BM, but only blood came out .  Denies pain, but endorses slight burning.  States that she continues to stay bloated "all day long".  States that she has elevated anxiety, states that she has difficulty sleeping , both falling asleep and staying asleep.  States that she has used melatonin and elavil without relief.          Past Medical History:  Diagnosis Date   Cholestasis of pregnancy in third trimester    Hx of chlamydia infection    Social History   Socioeconomic History   Marital status: Single    Spouse name: Not on file   Number of children: Not on file   Years of education: Not on file   Highest education level: Not on file  Occupational History   Not on file  Tobacco Use   Smoking status: Never   Smokeless tobacco: Never  Vaping Use   Vaping Use: Never used  Substance and Sexual Activity   Alcohol use: No    Alcohol/week: 0.0 standard drinks of alcohol   Drug use: No   Sexual activity: Yes  Other Topics Concern   Not on file  Social History Narrative   ** Merged History Encounter **       Social Determinants of Health   Financial Resource Strain: Not on file  Food Insecurity: Not on file  Transportation Needs: Not on file  Physical Activity: Not on file  Stress: Not on file   Social Connections: Not on file  Intimate Partner Violence: Not on file   Family History  Problem Relation Age of Onset   Asthma Mother    Diabetes Maternal Grandmother    Alcohol abuse Neg Hx    Arthritis Neg Hx    Birth defects Neg Hx    Cancer Neg Hx    COPD Neg Hx    Depression Neg Hx    Drug abuse Neg Hx    Early death Neg Hx    Hearing loss Neg Hx    Heart disease Neg Hx    Hyperlipidemia Neg Hx    Hypertension Neg Hx    Kidney disease Neg Hx    Learning disabilities Neg Hx    Mental illness Neg Hx    Mental retardation Neg Hx    Miscarriages / Stillbirths Neg Hx    Stroke Neg Hx    Vision loss Neg Hx    Varicose Veins Neg Hx    No Known Allergies  Review of Systems  Constitutional:  Negative for chills and fever.  HENT: Negative.    Eyes: Negative.   Respiratory:  Negative for shortness of breath.   Cardiovascular:  Negative for chest pain.  Gastrointestinal:  Positive for abdominal pain and blood in stool. Negative for constipation, diarrhea, nausea and vomiting.  Genitourinary:  Negative for dysuria.  Musculoskeletal:  Negative for back pain.  Skin: Negative.   Neurological: Negative.   Endo/Heme/Allergies: Negative.   Psychiatric/Behavioral:  The patient is nervous/anxious and has insomnia.       Objective:     BP 106/75 (BP Location: Left Arm, Patient Position: Sitting)   Ht 4\' 11"  (1.499 m)   Wt 112 lb (50.8 kg)   LMP 12/29/2021 (Exact Date)   BMI 22.62 kg/m    Physical Exam Vitals and nursing note reviewed.  Constitutional:      Appearance: Normal appearance. She is well-developed.  HENT:     Head: Normocephalic and atraumatic.     Right Ear: External ear normal.     Left Ear: External ear normal.     Nose: Nose normal.     Mouth/Throat:     Mouth: Mucous membranes are moist.     Pharynx: Oropharynx is clear.  Eyes:     Extraocular Movements: Extraocular movements intact.     Conjunctiva/sclera: Conjunctivae normal.     Pupils:  Pupils are equal, round, and reactive to light.  Cardiovascular:     Rate and Rhythm: Normal rate and regular rhythm.     Pulses: Normal pulses.     Heart sounds: Normal heart sounds.  Pulmonary:     Effort: Pulmonary effort is normal.     Breath sounds: Normal breath sounds.  Abdominal:     Comments: Unable to perform abdominal exam due to limited space in screening van  Musculoskeletal:        General: Normal range of motion.     Cervical back: Normal range of motion and neck supple.  Skin:    General: Skin is warm and dry.  Neurological:     General: No focal deficit present.     Mental Status: She is alert and oriented to person, place, and time.  Psychiatric:        Mood and Affect: Mood normal.        Behavior: Behavior normal.        Thought Content: Thought content normal.        Judgment: Judgment normal.        Assessment & Plan:   Problem List Items Addressed This Visit       Other   GAD (generalized anxiety disorder)   Relevant Medications   hydrOXYzine (ATARAX) 25 MG tablet   sertraline (ZOLOFT) 25 MG tablet   Other Visit Diagnoses     Left upper quadrant abdominal pain    -  Primary   Relevant Orders   02/28/2022 Abdomen Complete   H. pylori infection          1. Left upper quadrant abdominal pain Patient education given on supportive care - US Abdomen Complete; Future  2. H. pylori infection Continue with previously prescribed treatment plan  3. GAD (generalized anxiety disorder) Trial zoloft, hydroxyzine, patient education given on coping skills.  Red flags given for prompt reevaluation  - hydrOXYzine (ATARAX) 25 MG tablet; Take 1-2 tabs PO qhs PRN  Dispense: 60 tablet; Refill: 0 - sertraline (ZOLOFT) 25 MG tablet; Take 1 tablet (25 mg total) by mouth daily.  Dispense: 30 tablet; Refill: 1   I have reviewed the patient's medical history (PMH, PSH, Social History, Family History, Medications, and allergies) , and have been updated if relevant. I  spent 45 minutes reviewing  chart and  face to face time with patient.    Return in about 4 weeks (around 02/10/2022) for with [facility]U.    Kasandra Knudsen Mayers, PA-C

## 2022-01-13 NOTE — Telephone Encounter (Signed)
Called to schedule patient Abdomen US complete. Scheduler scheduled appointment for Friday 9.22.23 arrival time 8:45am [facility]

## 2022-01-16 ENCOUNTER — Ambulatory Visit (HOSPITAL_COMMUNITY): Admission: RE | Admit: 2022-01-16 | Payer: Self-pay | Source: Ambulatory Visit

## 2022-01-16 ENCOUNTER — Encounter (HOSPITAL_COMMUNITY): Payer: Self-pay

## 2022-01-26 ENCOUNTER — Other Ambulatory Visit: Payer: Self-pay

## 2022-01-30 ENCOUNTER — Telehealth: Payer: Self-pay | Admitting: Physician Assistant

## 2022-01-30 NOTE — Telephone Encounter (Signed)
Pt is calling to request if she can reschedule her [place]. Pt saw Cari on [employer]. Ordered US. Pt canceled [place] and in order to reschedule advised to contact Cari. CB- [phone]

## 2022-02-02 ENCOUNTER — Other Ambulatory Visit: Payer: Self-pay

## 2022-02-03 NOTE — Telephone Encounter (Signed)
Spoke with [employer] [employer] 10.09 we discussed appointment preference. On 10.10 Patient was rescheduled for a Pregnancy [place] on 10.17 at 8am. Patient aware that arrival time is 7:30 am, she is to be NPO after 12 am the night prior, and contact information given if she would need to reschedule. She is in understanding of appointment information and has no questions at this time.

## 2022-02-09 ENCOUNTER — Other Ambulatory Visit: Payer: Self-pay | Admitting: Pharmacist

## 2022-02-09 ENCOUNTER — Ambulatory Visit: Payer: Self-pay

## 2022-02-09 DIAGNOSIS — F411 Generalized anxiety disorder: Secondary | ICD-10-CM

## 2022-02-09 MED ORDER — SERTRALINE HCL 25 MG PO TABS: 25.0000 mg | ORAL_TABLET | Freq: Every day | ORAL | 1 refills | Status: DC

## 2022-02-09 NOTE — Telephone Encounter (Signed)
  Chief Complaint: medication assistance Symptoms: anxiety still over thinking at night and sometimes during day Frequency: ongoing for several weeks  Pertinent Negatives: NA Disposition: [] ED /[] Urgent Care (no appt availability in office) / [] Appointment(In office/virtual)/ []  [facility]/ [] Home Care/ [] Refused Recommended Disposition /[x] [facility]/ []  Follow-up with PCP Additional Notes: pt seen Cari, PA 4 weeks ago and was prescribed Sertraline. Feels like it is working but needing something stronger. Advised she was suppose to fu with Cari this week for FU appt. She is unable to go to today but states she can go tomorrow. Location details provided for tomorrow. Also advised pt that refill was given this morning for Sertraline. Pt verbalized understanding.    Summary: behavioral health / rx req   The patient has called to share that they're continuing to experience anxiety and difficulty sleeping   The patient has been previously prescribed sertraline (ZOLOFT) 25 MG tablet [[id number]]  but would like to be prescribed something stronger   The patient would like to discuss their concerns further with a member of clinical staff when possible      Reason for Disposition  [1] Caller has NON-URGENT medicine question about med that PCP prescribed AND [2] triager unable to answer question  Answer Assessment - Initial Assessment Questions 1. NAME of MEDICINE: "What medicine(s) are you calling about?"     Sertraline  2. QUESTION: "What is your question?" (e.g., double dose of medicine, side effect)     Wanting something stronger  3. PRESCRIBER: "Who prescribed the medicine?" Reason: if prescribed by specialist, call should be referred to that group.     Cari, PA  4. SYMPTOMS: "Do you have any symptoms?" If Yes, ask: "What symptoms are you having?"  "How bad are the symptoms (e.g., mild, moderate, severe)     Still having  5. PREGNANCY:  "Is there any chance that you  are pregnant?" "When was your last menstrual period?"     *No Answer*  Protocols used: Medication Question Call-A-AH

## 2022-02-10 ENCOUNTER — Ambulatory Visit (HOSPITAL_COMMUNITY)
Admission: RE | Admit: 2022-02-10 | Discharge: 2022-02-10 | Disposition: A | Discharge status: Home / Self Care | Payer: Self-pay | Source: Ambulatory Visit | Attending: Physician Assistant | Admitting: Physician Assistant

## 2022-02-10 DIAGNOSIS — R1012 Left upper quadrant pain: Secondary | ICD-10-CM | POA: Insufficient documentation

## 2022-02-12 ENCOUNTER — Other Ambulatory Visit: Payer: Self-pay | Admitting: Physician Assistant

## 2022-02-12 DIAGNOSIS — F411 Generalized anxiety disorder: Secondary | ICD-10-CM

## 2022-02-14 ENCOUNTER — Other Ambulatory Visit: Payer: Self-pay | Admitting: Physician Assistant

## 2022-02-14 DIAGNOSIS — F411 Generalized anxiety disorder: Secondary | ICD-10-CM

## 2022-02-19 ENCOUNTER — Other Ambulatory Visit: Payer: Self-pay | Admitting: Physician Assistant

## 2022-02-19 DIAGNOSIS — F411 Generalized anxiety disorder: Secondary | ICD-10-CM

## 2022-02-19 MED ORDER — HYDROXYZINE HCL 25 MG PO TABS: ORAL_TABLET | ORAL | 0 refills | Status: DC

## 2022-03-09 ENCOUNTER — Ambulatory Visit: Payer: Self-pay | Admitting: Physician Assistant

## 2022-03-09 ENCOUNTER — Encounter: Payer: Self-pay | Admitting: Physician Assistant

## 2022-03-09 VITALS — BP 107/76 | HR 67 | Ht 59.0 in | Wt 112.0 lb

## 2022-03-09 DIAGNOSIS — K219 Gastro-esophageal reflux disease without esophagitis: Secondary | ICD-10-CM

## 2022-03-09 DIAGNOSIS — F411 Generalized anxiety disorder: Secondary | ICD-10-CM

## 2022-03-09 MED ORDER — SERTRALINE HCL 50 MG PO TABS: 50.0000 mg | ORAL_TABLET | Freq: Every day | ORAL | 1 refills | Status: DC

## 2022-03-09 MED ORDER — OMEPRAZOLE 40 MG PO CPDR: 40.0000 mg | DELAYED_RELEASE_CAPSULE | Freq: Every day | ORAL | 1 refills | Status: DC

## 2022-03-09 MED ORDER — TRAZODONE HCL 50 MG PO TABS: 50.0000 mg | ORAL_TABLET | Freq: Every day | ORAL | 1 refills | Status: DC

## 2022-03-09 NOTE — Patient Instructions (Addendum)
You are going to increase your Zoloft to 50 mg.  You are going to start taking trazodone 50 mg at bedtime instead of the hydroxyzine.  I also encourage you to restart the Prilosec at this time.  I have started a referral for you to be seen by the clinic counselor.  Please let us know if there is anything else we can do for you.  Roney Jaffe, PA-C Physician Assistant [facility] Medicine [email or web address]   " Nothing is as bad as my brain wants to think that it is."  "I am safe and secure. I invite light and peace into this moment."   "My intuition will guide me through to emotional safety."  "These feelings will pass. Today I love myself just as I am: healthy and free."   "I accept my anxiety, but I remain in control of my heart and my breath."   "I am loved and supported. My feelings of anxiety are temporary."  "Anxious thoughts are not a reflection of who I am or what my reality is."   "I deserve to be gentle with myself. I deserve to prioritize my mental health."  "My higher power, who I trust and rely on, will guide me through this moment."   "I navigate this journey with kindness, patience, and compassion for myself."

## 2022-03-09 NOTE — Progress Notes (Signed)
Established Patient Office Visit  Subjective   Patient ID: Alicia Cochran, female    DOB: 1990-06-26  Age: 31 y.o. MRN: [id number]  Chief Complaint  Patient presents with   Anxiety    States that she has started having increased stress and anxiety despite taking 25 mg of Zoloft.  States that she did notice some improvement when she first started taking the Zoloft.  States that she is having difficulty sleeping despite taking 50 mg of hydroxyzine.  States that she does ruminate more in the evening, states that she has been waking up at approximately 3 or 4 AM and unable to return to sleep.  States that she has started having abdominal discomfort, feelings of bloated, and constipation again.  States that she has not been taking the Prilosec.      Past Medical History:  Diagnosis Date   Cholestasis of pregnancy in third trimester    Hx of chlamydia infection    Social History   Socioeconomic History   Marital status: Single    Spouse name: Not on file   Number of children: Not on file   Years of education: Not on file   Highest education level: Not on file  Occupational History   Not on file  Tobacco Use   Smoking status: Never   Smokeless tobacco: Never  Vaping Use   Vaping Use: Never used  Substance and Sexual Activity   Alcohol use: No    Alcohol/week: 0.0 standard drinks of alcohol   Drug use: No   Sexual activity: Yes  Other Topics Concern   Not on file  Social History Narrative   ** Merged History Encounter **       Social Determinants of Health   Financial Resource Strain: Not on file  Food Insecurity: Not on file  Transportation Needs: Not on file  Physical Activity: Not on file  Stress: Not on file  Social Connections: Not on file  Intimate Partner Violence: Not on file   Family History  Problem Relation Age of Onset   Asthma Mother    Diabetes Maternal Grandmother    Alcohol abuse Neg Hx    Arthritis Neg Hx    Birth defects Neg Hx     Cancer Neg Hx    COPD Neg Hx    Depression Neg Hx    Drug abuse Neg Hx    Early death Neg Hx    Hearing loss Neg Hx    Heart disease Neg Hx    Hyperlipidemia Neg Hx    Hypertension Neg Hx    Kidney disease Neg Hx    Learning disabilities Neg Hx    Mental illness Neg Hx    Mental retardation Neg Hx    Miscarriages / Stillbirths Neg Hx    Stroke Neg Hx    Vision loss Neg Hx    Varicose Veins Neg Hx    No Known Allergies  Review of Systems  Constitutional: Negative.   HENT: Negative.    Eyes: Negative.   Respiratory:  Negative for shortness of breath.   Cardiovascular:  Negative for chest pain.  Gastrointestinal:  Positive for abdominal pain and constipation. Negative for blood in stool, nausea and vomiting.  Genitourinary:  Negative for dysuria.  Musculoskeletal: Negative.   Skin: Negative.   Neurological: Negative.   Endo/Heme/Allergies: Negative.   Psychiatric/Behavioral:  The patient is nervous/anxious and has insomnia.       Objective:     BP 107/76 (BP Location:  Left Arm)   Pulse 67   Ht 4\' 11"  (1.499 m)   Wt 112 lb (50.8 kg)   LMP 02/26/2022 (Exact Date)   BMI 22.62 kg/m    Physical Exam Vitals reviewed.  Constitutional:      Appearance: Normal appearance.  HENT:     Head: Normocephalic and atraumatic.     Right Ear: External ear normal.     Left Ear: External ear normal.     Nose: Nose normal.     Mouth/Throat:     Mouth: Mucous membranes are moist.     Pharynx: Oropharynx is clear.  Eyes:     Extraocular Movements: Extraocular movements intact.     Conjunctiva/sclera: Conjunctivae normal.     Pupils: Pupils are equal, round, and reactive to light.  Cardiovascular:     Rate and Rhythm: Normal rate and regular rhythm.     Pulses: Normal pulses.     Heart sounds: Normal heart sounds.  Pulmonary:     Effort: Pulmonary effort is normal.     Breath sounds: Normal breath sounds.  Musculoskeletal:        General: Normal range of motion.      Cervical back: Normal range of motion and neck supple.  Skin:    General: Skin is warm and dry.  Neurological:     General: No focal deficit present.     Mental Status: She is alert and oriented to person, place, and time.  Psychiatric:        Mood and Affect: Mood normal.        Behavior: Behavior normal.        Thought Content: Thought content normal.        Judgment: Judgment normal.        Assessment & Plan:   Problem List Items Addressed This Visit       Other   GAD (generalized anxiety disorder) - Primary   Relevant Medications   sertraline (ZOLOFT) 50 MG tablet   traZODone (DESYREL) 50 MG tablet   Other Relevant Orders   Ambulatory referral to Social Work   Other Visit Diagnoses     Gastroesophageal reflux disease without esophagitis       Relevant Medications   omeprazole (PRILOSEC) 40 MG capsule     1. GAD (generalized anxiety disorder) Increase Zoloft 50 mg.  Trial trazodone.  Hold hydroxyzine at bedtime.  Patient education given on coping skills.  Refer to CBT.  Patient given application for [place] financial assistance patient does have upcoming [facility]  Red flags given for prompt reevaluation - sertraline (ZOLOFT) 50 MG tablet; Take 1 tablet (50 mg total) by mouth daily.  Dispense: 30 tablet; Refill: 1 - traZODone (DESYREL) 50 MG tablet; Take 1 tablet (50 mg total) by mouth at bedtime.  Dispense: 30 tablet; Refill: 1 - Ambulatory referral to Social Work  2. Gastroesophageal reflux disease without esophagitis Restart Prilosec. - omeprazole (PRILOSEC) 40 MG capsule; Take 1 capsule (40 mg total) by mouth daily.  Dispense: 30 capsule; Refill: 1   I have reviewed the patient's medical history (PMH, PSH, Social History, Family History, Medications, and allergies) , and have been updated if relevant. I spent 30 minutes reviewing chart and  face to face time with patient.    Return if  symptoms worsen or fail to improve.    13/05/2021 Mayers, PA-C

## 2022-03-23 ENCOUNTER — Ambulatory Visit: Payer: Self-pay | Admitting: Internal Medicine

## 2022-04-01 ENCOUNTER — Ambulatory Visit: Payer: Self-pay | Attending: Physician Assistant | Admitting: Physician Assistant

## 2022-04-01 ENCOUNTER — Other Ambulatory Visit: Payer: Self-pay

## 2022-04-01 VITALS — BP 99/61 | HR 75 | Ht 59.0 in | Wt 111.0 lb

## 2022-04-01 DIAGNOSIS — J020 Streptococcal pharyngitis: Secondary | ICD-10-CM

## 2022-04-01 DIAGNOSIS — Z23 Encounter for immunization: Secondary | ICD-10-CM

## 2022-04-01 LAB — POCT RAPID STREP A (OFFICE): Rapid Strep A Screen: POSITIVE — AB

## 2022-04-01 MED ORDER — AMOXICILLIN 500 MG PO CAPS
500.0000 mg | ORAL_CAPSULE | Freq: Three times a day (TID) | ORAL | 0 refills | Status: AC
  Filled 2022-04-01: qty 30, 10d supply, fill #0

## 2022-04-01 MED ORDER — IBUPROFEN 600 MG PO TABS
600.0000 mg | ORAL_TABLET | Freq: Three times a day (TID) | ORAL | 0 refills | Status: DC | PRN
  Filled 2022-04-01 (×2): qty 30, 10d supply, fill #0

## 2022-04-01 MED ORDER — FLUTICASONE PROPIONATE 50 MCG/ACT NA SUSP
2.0000 | Freq: Every day | NASAL | 6 refills | Status: DC
  Filled 2022-04-01: qty 16, 30d supply, fill #0

## 2022-04-01 NOTE — Progress Notes (Signed)
Patient ID: Alicia Cochran, female   DOB: [birth year], 31 y.o.   MRN: [id number]   Alicia Cochran, is a 31 y.o. female  [id number]  [id number]  DOB - Jul 05, 1990  Chief Complaint  Patient presents with   Sore Throat    X4 weeks sore throat on right side. Pain is radiating to ear        Subjective:   Alicia Cochran is a 31 y.o. female here today for ST. She had a ST about 1 month ago and had fever.  She developed sore throat again on R side of her throat about 4 days ago.  No fever this time.  Mild body aches.  +congestion and mild R ear pain.    No problems updated.  ALLERGIES: No Known Allergies  PAST MEDICAL HISTORY: Past Medical History:  Diagnosis Date   Cholestasis of pregnancy in third trimester    Hx of chlamydia infection     MEDICATIONS AT HOME: Prior to Admission medications   Medication Sig Start Date End Date Taking? Authorizing Provider  amoxicillin (AMOXIL) 500 MG capsule Take 1 capsule (500 mg total) by mouth 3 (three) times daily for 10 days. 04/01/22 04/11/22 Yes Myleka Moncure, Marzella Schlein, PA-C  fluticasone (FLONASE) 50 MCG/ACT nasal spray Place 2 sprays into both nostrils daily. 04/01/22  Yes Anders Simmonds, PA-C  ibuprofen (ADVIL) 600 MG tablet Take 1 tablet (600 mg total) by mouth every 8 (eight) hours as needed. 04/01/22  Yes Georgian Co M, PA-C  sertraline (ZOLOFT) 50 MG tablet Take 1 tablet (50 mg total) by mouth daily. 03/09/22  Yes Mayers, Cari S, PA-C  traZODone (DESYREL) 50 MG tablet Take 1 tablet (50 mg total) by mouth at bedtime. 03/09/22  Yes Mayers, Cari S, PA-C  hydrOXYzine (ATARAX) 25 MG tablet Take 1-2 tabs PO qhs PRN Patient not taking: Reported on 04/01/2022 02/19/22   Mayers, Cari S, PA-C  omeprazole (PRILOSEC) 40 MG capsule Take 1 capsule (40 mg total) by mouth daily. Patient not taking: Reported on 04/01/2022 03/09/22   Mayers, Cari S, PA-C    ROS: Neg resp Neg cardiac Neg GI Neg GU Neg MS Neg psych Neg  neuro  Objective:   Vitals:   04/01/22 1517  BP: 99/61  Pulse: 75  SpO2: 98%  Weight: 111 lb (50.3 kg)  Height: 4\' 11"  (1.499 m)   Exam General appearance : Awake, alert, not in any distress. Speech Clear. Not toxic looking.  Voice is normal. Not hot potato HEENT: Atraumatic and Normocephalic.  R TM is full and congested with mild erythema.  LTM bulging and dull.  Throat with PND and erythema on R>L.  Mild swelling B.   Uvula is midline.  Scant whitish exudate on R.  No deviation of uvula.   Neck: Supple, no JVD. Shotty ACx1 on R, not on L Chest: Good air entry bilaterally, CTAB.  No rales/rhonchi/wheezing CVS: S1 S2 regular, no murmurs.  Extremities: B/L Lower Ext shows no edema, both legs are warm to touch Neurology: Awake alert, and oriented X 3, CN II-XII intact, Non focal Skin: No Rash  Data Review No results found for: "HGBA1C"  Assessment & Plan   1. Strep pharyngitis Rapid strep is +.  Salt water gargles, fluids, rest - POCT rapid strep A - amoxicillin (AMOXIL) 500 MG capsule; Take 1 capsule (500 mg total) by mouth 3 (three) times daily for 10 days.  Dispense: 30 capsule; Refill: 0 - ibuprofen (ADVIL) 600 MG tablet; Take 1 tablet (600  mg total) by mouth every 8 (eight) hours as needed.  Dispense: 30 tablet; Refill: 0 - fluticasone (FLONASE) 50 MCG/ACT nasal spray; Place 2 sprays into both nostrils daily.  Dispense: 16 g; Refill: 6  2. Need for Tdap vaccination - Tdap vaccine greater than or equal to 7yo IM    Return if symptoms worsen or fail to improve.  The patient was given clear instructions to go to ER or return to medical center if symptoms don't improve, worsen or new problems develop. The patient verbalized understanding. The patient was told to call to get lab results if they haven't heard anything in the next week.      Georgian Co, PA-C [facility] [place], [place] [phone]   04/01/2022, 3:36 PM

## 2022-04-01 NOTE — Patient Instructions (Signed)
Strep Throat, Adult Strep throat is an infection of the throat. It is caused by germs (bacteria). Strep throat is common during the cold months of the year. It mostly affects children who are 5-31 years old. However, people of all ages can get it at any time of the year. This infection spreads from person to person through coughing, sneezing, or having close contact. What are the causes? This condition is caused by the Streptococcus pyogenes germ. What increases the risk? You care for young children. Children are more likely to get strep throat and may spread it to others. You go to crowded places. Germs can spread easily in such places. You kiss or touch someone who has strep throat. What are the signs or symptoms? Fever or chills. Redness, swelling, or pain in the tonsils or throat. Pain or trouble when swallowing. White or yellow spots on the tonsils or throat. Tender glands in the neck and under the jaw. Bad breath. Red rash all over the body. This is rare. How is this treated? Medicines that kill germs (antibiotics). Medicines that treat pain or fever. These include: Ibuprofen or acetaminophen. Aspirin, only for people who are over the age of 18. Cough drops. Throat sprays. Follow these instructions at home: Medicines  Take over-the-counter and prescription medicines only as told by your doctor. Take your antibiotic medicine as told by your doctor. Do not stop taking the antibiotic even if you start to feel better. Eating and drinking  If you have trouble swallowing, eat soft foods until your throat feels better. Drink enough fluid to keep your pee (urine) pale yellow. To help with pain, you may have: Warm fluids, such as soup and tea. Cold fluids, such as frozen desserts or popsicles. General instructions Rinse your mouth (gargle) with a salt-water mixture 3-4 times a day or as needed. To make a salt-water mixture, dissolve -1 tsp (3-6 g) of salt in 1 cup (237 mL) of warm  water. Rest as much as you can. Stay home from work or school until you have been taking antibiotics for 24 hours. Do not smoke or use any products that contain nicotine or tobacco. If you need help quitting, ask your doctor. Keep all follow-up visits. How is this prevented?  Do not share food, drinking cups, or personal items. They can cause the germs to spread. Wash your hands well with soap and water. Make sure that all people in your house wash their hands well. Have family members tested if they have a fever or a sore throat. They may need an antibiotic if they have strep throat. Contact a doctor if: You have swelling in your neck that keeps getting bigger. You get a rash, cough, or earache. You cough up a thick fluid that is green, yellow-brown, or bloody. You have pain that does not get better with medicine. Your symptoms get worse instead of getting better. You have a fever. Get help right away if: You vomit. You have a very bad headache. Your neck hurts or feels stiff. You have chest pain or are short of breath. You have drooling, very bad throat pain, or changes in your voice. Your neck is swollen, or the skin gets red and tender. Your mouth is dry, or you are peeing less than normal. You keep feeling more tired or have trouble waking up. Your joints are red or painful. These symptoms may be an emergency. Do not wait to see if the symptoms will go away. Get help right away. Call   your local emergency services (911 in the [place] Summary Strep throat is an infection of the throat. It is caused by germs (bacteria). This infection can spread from person to person through coughing, sneezing, or having close contact. Take your medicines, including antibiotics, as told by your doctor. Do not stop taking the antibiotic even if you start to feel better. To prevent the spread of germs, wash your hands well with soap and water. Have others do the same. Do not share food, drinking cups,  or personal items. Get help right away if you have a bad headache, chest pain, shortness of breath, a stiff or painful neck, or you vomit. This information is not intended to replace advice given to you by your health care provider. Make sure you discuss any questions you have with your health care provider. Document Revised: 08/06/2020 Document Reviewed: 08/06/2020 Elsevier Patient Education  2023 Elsevier Inc.  

## 2022-04-13 ENCOUNTER — Ambulatory Visit: Payer: Self-pay

## 2022-04-13 NOTE — Telephone Encounter (Signed)
Noted patient going to mobile unit. 

## 2022-04-13 NOTE — Telephone Encounter (Signed)
  Chief Complaint: strep throat FU Symptoms: sore throat still persistent and ears itching Frequency: 1 month Pertinent Negatives: NA Disposition: [] ED /[] Urgent Care (no appt availability in office) / [] Appointment(In office/virtual)/ []  [facility]/ [] Home Care/ [] Refused Recommended Disposition /[x] [place] [facility]/ []  Follow-up with PCP Additional Notes: pt states she has completed her abx but R side of throat still no better. Pt has been doing OTC tx as well with no relief. Offered appt for 04/15/22 but pt decline d/t having to work. Pt states she will go to [facility] today after work.   Summary: pt treated on mobile bus, pt not better reaching out   Pt has called [facility] as seen on the mobile bus on DEC 6/ No PCP, treated for Strep. On high dosage of amoxicillin but pt still has a sore throat, med gone, bothering and keeping awake at nite, since was seen on the mobile wanting to see if someone could FU as not well. [phone]       Reason for Disposition  [1] Taking antibiotic > 72 hours (3 days) for strep throat AND [2] sore throat not improved  Answer Assessment - Initial Assessment Questions 1. SYMPTOM: "What's the main symptom you're concerned about?" (e.g., fever, difficulty swallowing, sore throat)     Sore throat on R side  2. ANTIBIOTIC: "What antibiotic are you taking?" "How many times a day?"     Amoxicillin  3. ONSET: "When was the antibiotic started?"     1 month  4. THROAT PAIN:  "How bad is the sore throat?" (Scale 1-10; mild, moderate or severe)   - MILD (1-3):  Doesn't interfere with eating or normal activities.   - MODERATE (4-7): Interferes with eating some solids and normal activities.   - SEVERE (8-10):  Excruciating pain, interferes with most normal activities.   - SEVERE WITH DYSPHAGIA (10): Can't swallow liquids, drooling.     Moderate  5. FEVER: "Do you have a fever?" If Yes, ask: "What is your temperature, how was it measured, and when did it  start?"     no 6. OTHER SYMPTOMS: "Do you have any other symptoms?" (e.g., rash)     Ears itching  7. BETTER-SAME-WORSE: "Are you getting better, staying the same, or getting worse compared to the day you started the antibiotics?"     Not better  Protocols used: Strep Throat Infection on Antibiotic Follow-up Call-A-AH

## 2022-04-23 ENCOUNTER — Encounter: Payer: Self-pay | Admitting: Physician Assistant

## 2022-04-23 ENCOUNTER — Ambulatory Visit: Payer: Self-pay | Attending: Physician Assistant | Admitting: Physician Assistant

## 2022-04-23 ENCOUNTER — Other Ambulatory Visit: Payer: Self-pay

## 2022-04-23 VITALS — BP 99/66 | HR 69 | Temp 98.0°F | Resp 16 | Wt 111.4 lb

## 2022-04-23 DIAGNOSIS — H6991 Unspecified Eustachian tube disorder, right ear: Secondary | ICD-10-CM

## 2022-04-23 DIAGNOSIS — F411 Generalized anxiety disorder: Secondary | ICD-10-CM

## 2022-04-23 MED ORDER — HYDROXYZINE HCL 25 MG PO TABS
ORAL_TABLET | ORAL | 1 refills | Status: DC
  Filled 2022-04-23 – 2022-04-30 (×2): qty 40, 13d supply, fill #0
  Filled 2022-06-24 (×2): qty 40, 13d supply, fill #1

## 2022-04-23 MED ORDER — SERTRALINE HCL 100 MG PO TABS
100.0000 mg | ORAL_TABLET | Freq: Every day | ORAL | 1 refills | Status: DC
  Filled 2022-04-23 – 2022-04-30 (×2): qty 90, 90d supply, fill #0
  Filled 2022-09-23: qty 90, 90d supply, fill #1

## 2022-04-23 MED ORDER — TRAZODONE HCL 50 MG PO TABS
50.0000 mg | ORAL_TABLET | Freq: Every evening | ORAL | 5 refills | Status: DC | PRN
  Filled 2022-04-23 – 2022-04-30 (×2): qty 60, 30d supply, fill #0
  Filled 2022-06-09: qty 60, 30d supply, fill #1
  Filled 2022-08-11: qty 60, 30d supply, fill #2
  Filled 2022-09-23: qty 60, 30d supply, fill #3
  Filled 2022-11-17: qty 60, 30d supply, fill #4
  Filled 2022-12-29: qty 60, 30d supply, fill #5

## 2022-04-23 NOTE — Progress Notes (Signed)
Patient ID: Alicia Cochran, female   DOB: 11/17/1990, 31 y.o.   MRN: [id number]   Angely Ponds, is a 31 y.o. female  [id number]  [id number]  DOB - [birth year]  No chief complaint on file.      Subjective:   Alicia Cochran is a 31 y.o. female here today for Rf zoloft and trazadone.  She is doing better on zoloft and feels it is helping her anxiety and depression but she is still feeling more anxiety than she would like to.  Denies SI/HI.  Trazadone helps with sleep but she is still waking up often.    I saw her a few weeks ago on the mobile unit and she had strep throat.  She completed the antibiotics but still has R ear discomfort and pressure.  She completed the amoxicillin. No problems updated.  ALLERGIES: No Known Allergies  PAST MEDICAL HISTORY: Past Medical History:  Diagnosis Date   Cholestasis of pregnancy in third trimester    Hx of chlamydia infection     MEDICATIONS AT HOME: Prior to Admission medications   Medication Sig Start Date End Date Taking? Authorizing Provider  fluticasone (FLONASE) 50 MCG/ACT nasal spray Place 2 sprays into both nostrils daily. 04/01/22  Yes Argentina Donovan, PA-C  ibuprofen (ADVIL) 600 MG tablet Take 1 tablet (600 mg total) by mouth every 8 (eight) hours as needed. 04/01/22  Yes Freeman Caldron M, PA-C  sertraline (ZOLOFT) 100 MG tablet Take 1 tablet (100 mg total) by mouth daily. 04/23/22  Yes Argentina Donovan, PA-C  hydrOXYzine (ATARAX) 25 MG tablet 1/2 to 1 tab for feelings of severe anxiety up to 3 times daily.  Use sparingly 04/23/22   Argentina Donovan, PA-C  omeprazole (PRILOSEC) 40 MG capsule Take 1 capsule (40 mg total) by mouth daily. Patient not taking: Reported on 04/01/2022 03/09/22   Mayers, Loraine Grip, PA-C  traZODone (DESYREL) 50 MG tablet 1 to 2 tabs at bedtime prn insomnia 04/23/22   Argentina Donovan, PA-C    ROS: Neg resp Neg cardiac Neg GI Neg GU Neg MS Neg neuro  Objective:    Vitals:   04/23/22 0850  BP: 99/66  Pulse: 69  Resp: 16  Temp: 98 F (36.7 C)  TempSrc: Oral  SpO2: 98%  Weight: 111 lb 6.4 oz (50.5 kg)   Exam General appearance : Awake, alert, not in any distress. Speech Clear. Not toxic looking HEENT: Atraumatic and Normocephalic, throat with PND but no swelling, no erythema.  No exudate/no swelling.  L TM WNL, R TM bulging, no infection.   Neck: Supple, no JVD. No cervical lymphadenopathy.  Chest: Good air entry bilaterally, CTAB.  No rales/rhonchi/wheezing CVS: S1 S2 regular, no murmurs.  Extremities: B/L Lower Ext shows no edema, both legs are warm to touch Neurology: Awake alert, and oriented X 3, CN II-XII intact, Non focal Skin: No Rash  Data Review No results found for: "HGBA1C"  Assessment & Plan   1. GAD (generalized anxiety disorder) Stable/improved but will increase doses to fill gaps - sertraline (ZOLOFT) 100 MG tablet; Take 1 tablet (100 mg total) by mouth daily.  Dispense: 90 tablet; Refill: 1 - traZODone (DESYREL) 50 MG tablet; 1 to 2 tabs at bedtime prn insomnia  Dispense: 60 tablet; Refill: 5 - hydrOXYzine (ATARAX) 25 MG tablet; 1/2 to 1 tab for feelings of severe anxiety up to 3 times daily.  Use sparingly  Dispense: 40 tablet; Refill: 1  2. Dysfunction of right eustachian tube Use  flonase for at least the next 3 weeks and add sudafed OR phenylephrine as needed    Return in about 6 months (around 10/23/2022) for PCP for depression/anxiety/sleep.  The patient was given clear instructions to go to ER or return to medical center if symptoms don't improve, worsen or new problems develop. The patient verbalized understanding. The patient was told to call to get lab results if they haven't heard anything in the next week.      Georgian Co, PA-C [facility] and [facility] [place], [place] [phone]   04/23/2022, 9:12 AM

## 2022-04-23 NOTE — Patient Instructions (Signed)
Sudafed or phenylephrine  Eustachian Tube Dysfunction  Eustachian tube dysfunction refers to a condition in which a blockage develops in the narrow passage that connects the middle ear to the back of the nose (eustachian tube). The eustachian tube regulates air pressure in the middle ear by letting air move between the ear and nose. It also helps to drain fluid from the middle ear space. Eustachian tube dysfunction can affect one or both ears. When the eustachian tube does not function properly, air pressure, fluid, or both can build up in the middle ear. What are the causes? This condition occurs when the eustachian tube becomes blocked or cannot open normally. Common causes of this condition include: Ear infections. Colds and other infections that affect the nose, mouth, and throat (upper respiratory tract). Allergies. Irritation from cigarette smoke. Irritation from stomach acid coming up into the esophagus (gastroesophageal reflux). The esophagus is the part of the body that moves food from the mouth to the stomach. Sudden changes in air pressure, such as from descending in an airplane or scuba diving. Abnormal growths in the nose or throat, such as: Growths that line the nose (nasal polyps). Abnormal growth of cells (tumors). Enlarged tissue at the back of the throat (adenoids). What increases the risk? You are more likely to develop this condition if: You smoke. You are overweight. You are a child who has: Certain birth defects of the mouth, such as cleft palate. Large tonsils or adenoids. What are the signs or symptoms? Common symptoms of this condition include: A feeling of fullness in the ear. Ear pain. Clicking or popping noises in the ear. Ringing in the ear (tinnitus). Hearing loss. Loss of balance. Dizziness. Symptoms may get worse when the air pressure around you changes, such as when you travel to an area of high elevation, fly on an airplane, or go scuba diving. How  is this diagnosed? This condition may be diagnosed based on: Your symptoms. A physical exam of your ears, nose, and throat. Tests, such as those that measure: The movement of your eardrum. Your hearing (audiometry). How is this treated? Treatment depends on the cause and severity of your condition. In mild cases, you may relieve your symptoms by moving air into your ears. This is called "popping the ears." In more severe cases, or if you have symptoms of fluid in your ears, treatment may include: Medicines to relieve congestion (decongestants). Medicines that treat allergies (antihistamines). Nasal sprays or ear drops that contain medicines that reduce swelling (steroids). A procedure to drain the fluid in your eardrum. In this procedure, a small tube may be placed in the eardrum to: Drain the fluid. Restore the air in the middle ear space. A procedure to insert a balloon device through the nose to inflate the opening of the eustachian tube (balloon dilation). Follow these instructions at home: Lifestyle Do not do any of the following until your health care provider approves: Travel to high altitudes. Fly in airplanes. Work in a Estate agent or room. Scuba dive. Do not use any products that contain nicotine or tobacco. These products include cigarettes, chewing tobacco, and vaping devices, such as e-cigarettes. If you need help quitting, ask your health care provider. Keep your ears dry. Wear fitted earplugs during showering and bathing. Dry your ears completely after. General instructions Take over-the-counter and prescription medicines only as told by your health care provider. Use techniques to help pop your ears as recommended by your health care provider. These may include: Chewing gum.  Yawning. Frequent, forceful swallowing. Closing your mouth, holding your nose closed, and gently blowing as if you are trying to blow air out of your nose. Keep all follow-up visits. This  is important. Contact a health care provider if: Your symptoms do not go away after treatment. Your symptoms come back after treatment. You are unable to pop your ears. You have: A fever. Pain in your ear. Pain in your head or neck. Fluid draining from your ear. Your hearing suddenly changes. You become very dizzy. You lose your balance. Get help right away if: You have a sudden, severe increase in any of your symptoms. Summary Eustachian tube dysfunction refers to a condition in which a blockage develops in the eustachian tube. It can be caused by ear infections, allergies, inhaled irritants, or abnormal growths in the nose or throat. Symptoms may include ear pain or fullness, hearing loss, or ringing in the ears. Mild cases are treated with techniques to unblock the ears, such as yawning or chewing gum. More severe cases are treated with medicines or procedures. This information is not intended to replace advice given to you by your health care provider. Make sure you discuss any questions you have with your health care provider. Document Revised: 06/24/2020 Document Reviewed: 06/24/2020 Elsevier Patient Education  2023 [employer].

## 2022-04-23 NOTE — Progress Notes (Signed)
Patient said she is still having pain on her right side of neck and both ears after taking her abx.

## 2022-04-30 ENCOUNTER — Other Ambulatory Visit: Payer: Self-pay

## 2022-05-07 ENCOUNTER — Ambulatory Visit: Payer: Self-pay | Admitting: Internal Medicine

## 2022-06-09 ENCOUNTER — Other Ambulatory Visit: Payer: Self-pay

## 2022-06-15 ENCOUNTER — Other Ambulatory Visit: Payer: Self-pay

## 2022-06-24 ENCOUNTER — Other Ambulatory Visit: Payer: Self-pay

## 2022-06-30 ENCOUNTER — Encounter: Payer: Self-pay | Admitting: Internal Medicine

## 2022-06-30 ENCOUNTER — Ambulatory Visit: Payer: Self-pay | Attending: Internal Medicine | Admitting: Internal Medicine

## 2022-06-30 VITALS — BP 98/64 | HR 59 | Temp 97.8°F | Ht 59.0 in | Wt 112.0 lb

## 2022-06-30 DIAGNOSIS — J029 Acute pharyngitis, unspecified: Secondary | ICD-10-CM

## 2022-06-30 DIAGNOSIS — K625 Hemorrhage of anus and rectum: Secondary | ICD-10-CM

## 2022-06-30 DIAGNOSIS — R1013 Epigastric pain: Secondary | ICD-10-CM

## 2022-06-30 LAB — POCT RAPID STREP A (OFFICE): Rapid Strep A Screen: NEGATIVE

## 2022-06-30 NOTE — Progress Notes (Signed)
Patient ID: Alicia Cochran, female    DOB: 12-Aug-1990  MRN: [id number]  CC: Establish Care (Est care / new pt. Janene Harvey on L flank, swelling of abdomen - previously diagnosed with H pylori, unable to eat & sleep, blood in stool/Requesting strep throat due to sore throat x1 week /No to flu vax. Pap done recently - instructed to sign ROI)   Subjective: Alicia Cochran is a 32 y.o. female who presents wanting to est care with me. Her concerns today include:  Pt with hx of MDD/GAD  Pt had been seeing our PA [employer].  Pt c/o problem with her stomach.  Dx with H.pylori 12/2021 and was treated.  Did well for 2 mths.  Having same symptoms again x 1.5 mths ago.   Feels like she has gas LUQ abdomen Bloating and full felling about 30 mins after meals; occurs with any thing she eats.  Up all night with bloating. Was taking Prilosec in a.m but it has not helped.  No N/V/D. BM regular but feels like she is not getting everything out.  Stools not hard.  Blood in stools last wk for 4 days in a row.  Blood was mixed in with the stools and drops of blood from rectum.  No hemorrhoids as far as she is aware.  No rectal pain but a burning sensation.    Also c/o sore throat on LT side x 1 wk.  Hurts to swallow.  Wants strep test.  No fever.  Using tea and cough drops.    HM:  had pap at [facility] in Dec last yr or Jan of this yr.  Nl.   Patient Active Problem List   Diagnosis Date Noted   Gastroesophageal reflux disease without esophagitis 03/09/2022   Pelvic pain in female 07/30/2017   GAD (generalized anxiety disorder) 06/04/2017   History of ovarian cyst 06/04/2017   Ovarian cyst 01/01/2017     Current Outpatient Medications on File Prior to Visit  Medication Sig Dispense Refill   hydrOXYzine (ATARAX) 25 MG tablet take 1/2 to 1 tab for feelings of severe anxiety up to 3 times daily.  (Use sparingly) 40 tablet 1   sertraline (ZOLOFT) 100 MG tablet Take 1 tablet (100 mg total) by mouth  daily. 90 tablet 1   traZODone (DESYREL) 50 MG tablet Take 1-2 tablets (50-100 mg total) by mouth at bedtime as needed for insomnia. 60 tablet 5   fluticasone (FLONASE) 50 MCG/ACT nasal spray Place 2 sprays into both nostrils daily. (Patient not taking: Reported on 06/30/2022) 16 g 6   ibuprofen (ADVIL) 600 MG tablet Take 1 tablet (600 mg total) by mouth every 8 (eight) hours as needed. (Patient not taking: Reported on 06/30/2022) 30 tablet 0   omeprazole (PRILOSEC) 40 MG capsule Take 1 capsule (40 mg total) by mouth daily. (Patient not taking: Reported on 04/01/2022) 30 capsule 1   No current facility-administered medications on file prior to visit.    No Known Allergies  Social History   Socioeconomic History   Marital status: Single    Spouse name: Not on file   Number of children: Not on file   Years of education: Not on file   Highest education level: Not on file  Occupational History   Not on file  Tobacco Use   Smoking status: Never   Smokeless tobacco: Never  Vaping Use   Vaping Use: Never used  Substance and Sexual Activity   Alcohol use: No  Alcohol/week: 0.0 standard drinks of alcohol   Drug use: No   Sexual activity: Yes  Other Topics Concern   Not on file  Social History Narrative   ** Merged History Encounter **       Social Determinants of Health   Financial Resource Strain: Not on file  Food Insecurity: Not on file  Transportation Needs: Not on file  Physical Activity: Not on file  Stress: Not on file  Social Connections: Not on file  Intimate Partner Violence: Not on file    Family History  Problem Relation Age of Onset   Asthma Mother    Diabetes Maternal Grandmother    Alcohol abuse Neg Hx    Arthritis Neg Hx    Birth defects Neg Hx    Cancer Neg Hx    COPD Neg Hx    Depression Neg Hx    Drug abuse Neg Hx    Early death Neg Hx    Hearing loss Neg Hx    Heart disease Neg Hx    Hyperlipidemia Neg Hx    Hypertension Neg Hx    Kidney disease  Neg Hx    Learning disabilities Neg Hx    Mental illness Neg Hx    Mental retardation Neg Hx    Miscarriages / Stillbirths Neg Hx    Stroke Neg Hx    Vision loss Neg Hx    Varicose Veins Neg Hx     Past Surgical History:  Procedure Laterality Date   CYSTO WITH HYDRODISTENSION N/A 10/07/2018   Procedure: CYSTOSCOPY/HYDRODISTENSION INSTILL MARCAINE AND PYRIDIUM;  Surgeon: Irine Seal, MD;  Location: [facility];  Service: Urology;  Laterality: N/A;    ROS: Review of Systems Negative except as stated above  PHYSICAL EXAM: BP 98/64 (BP Location: Left Arm, Patient Position: Sitting, Cuff Size: Normal)   Pulse (!) 59   Temp 97.8 F (36.6 C) (Oral)   Ht '4\' 11"'$  (1.499 m)   Wt 112 lb (50.8 kg)   SpO2 99%   BMI 22.62 kg/m   Physical Exam  General appearance - alert, well appearing, young Hispanic female and in no distress Mental status - normal mood, behavior, speech, dress, motor activity, and thought processes Abdomen - soft, nontender, nondistended, no masses or organomegaly Rectal -CMA Kathy Breach present: No external hemorrhoids noted.  No prolapsing internal hemorrhoids noted.  No rectal tears      Latest Ref Rng & Units 12/30/2021   11:31 AM 11/10/2016    4:32 PM 10/06/2016   12:15 PM  CMP  Glucose 70 - 99 mg/dL 79  76  85   BUN 6 - 20 mg/dL '14  11  7   '$ Creatinine 0.57 - 1.00 mg/dL 0.72  0.60  0.58   Sodium 134 - 144 mmol/L 141  141  140   Potassium 3.5 - 5.2 mmol/L 4.1  4.0  3.8   Chloride 96 - 106 mmol/L 105  102  104   CO2 20 - 29 mmol/L  23  24   Calcium 8.7 - 10.2 mg/dL 9.3  9.3  8.9   Total Protein 6.0 - 8.5 g/dL 6.4  6.8  6.6   Total Bilirubin 0.0 - 1.2 mg/dL 0.2  0.3  0.2   Alkaline Phos 44 - 121 IU/L 48  50  60   AST 0 - 40 IU/L '16  21  13   '$ ALT 0 - 32 IU/L  39  11    Lipid Panel  No  results found for: "CHOL", "TRIG", "HDL", "CHOLHDL", "VLDL", "LDLCALC", "LDLDIRECT"  CBC    Component Value Date/Time   WBC 6.6 12/30/2021 1131   WBC 11.0 (H) 12/11/2014  0536   RBC 4.17 12/30/2021 1131   RBC 3.72 (L) 12/11/2014 0536   HGB 13.6 12/30/2021 1131   HCT 38.9 12/30/2021 1131   PLT 209 12/30/2021 1131   MCV 93 12/30/2021 1131   MCH 32.6 12/30/2021 1131   MCH 32.3 12/11/2014 0536   MCHC 35.0 12/30/2021 1131   MCHC 34.1 12/11/2014 0536   RDW 12.8 12/30/2021 1131   LYMPHSABS 1.5 12/30/2021 1131   EOSABS 0.0 12/30/2021 1131   BASOSABS 0.0 12/30/2021 1131   Results for orders placed or performed in visit on 06/30/22  Rapid Strep A  Result Value Ref Range   Rapid Strep A Screen Negative Negative    ASSESSMENT AND PLAN:  1. Dyspepsia Patient presenting with dyspepsia.  We will recheck for H. pylori. - H.pylori screen, POC - Ambulatory referral to Gastroenterology  2. Rectal bleeding - Ambulatory referral to Gastroenterology  3. Sore throat Strep test was negative.  Most likely viral pharyngitis.  Recommend gargling with warm fluid. - Rapid Strep A    Patient was given the opportunity to ask questions.  Patient verbalized understanding of the plan and was able to repeat key elements of the plan.   This documentation was completed using Radio producer.  Any transcriptional errors are unintentional.  No orders of the defined types were placed in this encounter.    Requested Prescriptions    No prescriptions requested or ordered in this encounter    No follow-ups on file.  Karle Plumber, MD, FACP

## 2022-07-02 LAB — H. PYLORI BREATH TEST: H pylori Breath Test: NEGATIVE

## 2022-07-09 ENCOUNTER — Encounter: Payer: Self-pay | Admitting: Gastroenterology

## 2022-07-13 ENCOUNTER — Other Ambulatory Visit (HOSPITAL_BASED_OUTPATIENT_CLINIC_OR_DEPARTMENT_OTHER): Payer: Self-pay

## 2022-08-11 ENCOUNTER — Other Ambulatory Visit: Payer: Self-pay

## 2022-08-11 ENCOUNTER — Other Ambulatory Visit: Payer: Self-pay | Admitting: Physician Assistant

## 2022-08-11 DIAGNOSIS — F411 Generalized anxiety disorder: Secondary | ICD-10-CM

## 2022-08-11 MED ORDER — HYDROXYZINE HCL 25 MG PO TABS
12.5000 mg | ORAL_TABLET | Freq: Three times a day (TID) | ORAL | 1 refills | Status: DC | PRN
  Filled 2022-08-11: qty 40, 14d supply, fill #0
  Filled 2022-09-23: qty 40, 13d supply, fill #1

## 2022-08-13 ENCOUNTER — Other Ambulatory Visit: Payer: Self-pay

## 2022-09-08 ENCOUNTER — Ambulatory Visit: Payer: Self-pay | Admitting: Gastroenterology

## 2022-09-23 ENCOUNTER — Other Ambulatory Visit: Payer: Self-pay

## 2022-10-23 ENCOUNTER — Ambulatory Visit: Payer: Self-pay | Admitting: Internal Medicine

## 2022-10-28 ENCOUNTER — Other Ambulatory Visit: Payer: Self-pay | Admitting: Internal Medicine

## 2022-10-28 DIAGNOSIS — F411 Generalized anxiety disorder: Secondary | ICD-10-CM

## 2022-10-28 MED ORDER — HYDROXYZINE HCL 25 MG PO TABS
12.5000 mg | ORAL_TABLET | Freq: Three times a day (TID) | ORAL | 0 refills | Status: DC | PRN
  Filled 2022-10-28: qty 270, 90d supply, fill #0

## 2022-10-28 NOTE — Telephone Encounter (Signed)
Requested Prescriptions  Pending Prescriptions Disp Refills   hydrOXYzine (ATARAX) 25 MG tablet 270 tablet 0    Sig: Take 0.5-1 tablets (12.5-25 mg total) by mouth for feelings of severe anxiety up to 3 times daily.  (Use sparingly)     Ear, Nose, and Throat:  Antihistamines 2 Passed - 10/28/2022  3:26 PM      Passed - Cr in normal range and within 360 days    Creatinine, Ser  Date Value Ref Range Status  12/30/2021 0.72 0.57 - 1.00 mg/dL Final         Passed - Valid encounter within last 12 months    Recent Outpatient Visits           4 months ago Dyspepsia   [facility] Marcine Matar, MD   6 months ago Dysfunction of right eustachian tube   [facility] [place], [place], [place]   7 months ago Strep pharyngitis   [facility] [place], [place], [place]   3 years ago Tooth infection   [facility] [place], [place] J, NP   3 years ago History of UTI   [facility] [place], [place]ella Sch[place], [place]       Future Appointments             In 2 months Laural Benes, Binnie Rail, MD [facility]

## 2022-10-28 NOTE — Telephone Encounter (Signed)
Medication Refill - Medication: hydrOXYzine (ATARAX) 25 MG tablet   Has the patient contacted their pharmacy? No. No, more refills.  (Agent: If no, request that the patient contact the pharmacy for the refill. If patient does not wish to contact the pharmacy document the reason why and proceed with request.)   Preferred Pharmacy (with phone number or street name):  [facility]  [address] [place] [address]  Phone: [phone] Fax: [phone]  Hours: M-F 7:30a-6:00p   Has the patient been seen for an appointment in the last year OR does the patient have an upcoming appointment? Yes.    Agent: Please be advised that RX refills may take up to 3 business days. We ask that you follow-up with your pharmacy.

## 2022-10-30 ENCOUNTER — Other Ambulatory Visit: Payer: Self-pay

## 2022-11-17 ENCOUNTER — Other Ambulatory Visit: Payer: Self-pay

## 2022-11-17 ENCOUNTER — Ambulatory Visit: Payer: Self-pay | Admitting: Physician Assistant

## 2022-11-17 ENCOUNTER — Encounter: Payer: Self-pay | Admitting: Physician Assistant

## 2022-11-17 VITALS — BP 102/68 | Ht 59.0 in | Wt 117.0 lb

## 2022-11-17 DIAGNOSIS — F411 Generalized anxiety disorder: Secondary | ICD-10-CM

## 2022-11-17 DIAGNOSIS — Z8619 Personal history of other infectious and parasitic diseases: Secondary | ICD-10-CM

## 2022-11-17 DIAGNOSIS — K219 Gastro-esophageal reflux disease without esophagitis: Secondary | ICD-10-CM

## 2022-11-17 DIAGNOSIS — R531 Weakness: Secondary | ICD-10-CM

## 2022-11-17 DIAGNOSIS — R197 Diarrhea, unspecified: Secondary | ICD-10-CM

## 2022-11-17 LAB — GLUCOSE, POCT (MANUAL RESULT ENTRY): POC Glucose: 89 mg/dl (ref 70–99)

## 2022-11-17 MED ORDER — SERTRALINE HCL 100 MG PO TABS
100.0000 mg | ORAL_TABLET | Freq: Every day | ORAL | 1 refills | Status: DC
  Filled 2022-11-17 – 2022-12-31 (×2): qty 90, 90d supply, fill #0

## 2022-11-17 MED ORDER — CIPROFLOXACIN HCL 500 MG PO TABS
500.0000 mg | ORAL_TABLET | Freq: Two times a day (BID) | ORAL | 0 refills | Status: AC
  Filled 2022-11-17: qty 10, 5d supply, fill #0

## 2022-11-17 MED ORDER — OMEPRAZOLE 20 MG PO CPDR
20.0000 mg | DELAYED_RELEASE_CAPSULE | Freq: Every day | ORAL | 1 refills | Status: DC
  Filled 2022-11-17: qty 30, 30d supply, fill #0

## 2022-11-17 NOTE — Patient Instructions (Signed)
You are going to take Cipro twice a day for 5 days.  I encourage you to continue staying very well-hydrated, continue taking your probiotic and increase your fiber intake.  I also encourage you to take omeprazole 20 mg once a day.  Make sure that you keep your follow-up appointment with gastroenterology.  Your random blood sugar level was 89, this is a normal reading.  Please let us know if there is anything else we can do for you  Roney Jaffe, PA-C Physician Assistant [facility] [email or web address]   Diarrhea, Adult Diarrhea is frequent loose and sometimes watery bowel movements. Diarrhea can make you feel weak and cause you to become dehydrated. Dehydration is a condition in which there is not enough water or other fluids in the body. Dehydration can make you tired and thirsty, cause you to have a dry mouth, and decrease how often you urinate. Diarrhea typically lasts 2-3 days. However, it can last longer if it is a sign of something more serious. It is important to treat your diarrhea as told by your health care provider. Follow these instructions at home: Eating and drinking     Follow these recommendations as told by your health care provider: Take an oral rehydration solution (ORS). This is an over-the-counter medicine that helps return your body to its normal balance of nutrients and water. It is found at pharmacies and retail stores. Drink enough fluid to keep your urine pale yellow. Drink fluids such as water, diluted fruit juice, and low-calorie sports drinks. You can drink milk also, if desired. Sucking on ice chips is another way to get fluids. Avoid drinking fluids that contain a lot of sugar or caffeine, such as soda, energy drinks, and regular sports drinks. Avoid alcohol. Eat bland, easy-to-digest foods in small amounts as you are able. These foods include bananas, applesauce, rice, lean meats, toast, and  crackers. Avoid spicy or fatty foods.  Medicines Take over-the-counter and prescription medicines only as told by your health care provider. If you were prescribed antibiotics, take them as told by your health care provider. Do not stop using the antibiotic even if you start to feel better. General instructions  Wash your hands often using soap and water for at least 20 seconds. If soap and water are not available, use hand sanitizer. Others in the household should wash their hands as well. Hands should be washed: After using the toilet or changing a diaper. Before preparing, cooking, or serving food. While caring for a sick person or while visiting someone in a hospital. Rest at home while you recover. Take a warm bath to relieve any burning or pain from frequent diarrhea episodes. Watch your condition for any changes. Contact a health care provider if: You have a fever. Your diarrhea gets worse. You have new symptoms. You vomit every time you eat or drink. You feel light-headed, dizzy, or have a headache. You have muscle cramps. You have signs of dehydration, such as: Dark urine, very little urine, or no urine. Cracked lips. Dry mouth. Sunken eyes. Sleepiness. Weakness. You have bloody or black stools or stools that look like tar. You have severe pain, cramping, or bloating in your abdomen. Your skin feels cold and clammy. You feel confused. Get help right away if: You have chest pain or your heart is beating very quickly. You have trouble breathing or you are breathing very quickly. You feel extremely weak or you faint. These symptoms may be an emergency. Get help right  away. Call 911. Do not wait to see if the symptoms will go away. Do not drive yourself to the hospital. This information is not intended to replace advice given to you by your health care provider. Make sure you discuss any questions you have with your health care provider. Document Revised: 09/30/2021  Document Reviewed: 09/30/2021 Elsevier Patient Education  2024 [employer].

## 2022-11-17 NOTE — Progress Notes (Unsigned)
Established Patient Office Visit  Subjective   Patient ID: Alicia Cochran, female    DOB: 14-Jun-1990  Age: 32 y.o. MRN: [id number]  Chief Complaint  Patient presents with   Constipation    Bloated, Pt states when she goes she has diarrhea.      States that she has been experiencing constipation.  States that this has been ongoing since the end of May.  States that she will go several days without using the bathroom and then experience loose bowels.  Denies rectal pain or bleeding.  States that she will experience bloating until she is able to have a bowel movement, reduced appetite as well.  States that she will experience cramping prior to bowel movement that is resolved with bowel movement.  Denies nighttime awakenings.  States that she has been taking probiotics for the past 3 years, states that she drinks plenty of water on a daily basis and eats plenty of fruit on a daily basis.  Does endorse that she discontinued the omeprazole.  States that her anxiety and sleep are well-controlled.  Does endorse that she got a new puppy in May who was diagnosed with coccidia and was treated with antibiotics.  No one else in house with similar symptoms.    Also complains of episodes of feeling weak or shaky if she has gone a long period of time without eating.    Past Medical History:  Diagnosis Date   Cholestasis of pregnancy in third trimester    Hx of chlamydia infection    Social History   Socioeconomic History   Marital status: Single    Spouse name: Not on file   Number of children: Not on file   Years of education: Not on file   Highest education level: Not on file  Occupational History   Not on file  Tobacco Use   Smoking status: Never    Passive exposure: Never   Smokeless tobacco: Never  Vaping Use   Vaping status: Never Used  Substance and Sexual Activity   Alcohol use: No    Alcohol/week: 0.0 standard drinks of alcohol   Drug use: No   Sexual activity:  Yes  Other Topics Concern   Not on file  Social History Narrative   ** Merged History Encounter **       Social Determinants of Health   Financial Resource Strain: Not on file  Food Insecurity: Not on file  Transportation Needs: Not on file  Physical Activity: Not on file  Stress: Not on file  Social Connections: Not on file  Intimate Partner Violence: Not on file   Family History  Problem Relation Age of Onset   Asthma Mother    Diabetes Maternal Grandmother    Alcohol abuse Neg Hx    Arthritis Neg Hx    Birth defects Neg Hx    Cancer Neg Hx    COPD Neg Hx    Depression Neg Hx    Drug abuse Neg Hx    Early death Neg Hx    Hearing loss Neg Hx    Heart disease Neg Hx    Hyperlipidemia Neg Hx    Hypertension Neg Hx    Kidney disease Neg Hx    Learning disabilities Neg Hx    Mental illness Neg Hx    Mental retardation Neg Hx    Miscarriages / Stillbirths Neg Hx    Stroke Neg Hx    Vision loss Neg Hx    Varicose Veins  Neg Hx    No Known Allergies  Review of Systems  Constitutional:  Negative for chills and fever.  HENT: Negative.    Eyes: Negative.   Respiratory:  Negative for shortness of breath.   Cardiovascular:  Negative for chest pain.  Gastrointestinal:  Positive for abdominal pain, constipation and diarrhea. Negative for nausea and vomiting.  Genitourinary:  Negative for dysuria, frequency and urgency.  Musculoskeletal: Negative.   Skin: Negative.   Neurological:  Positive for weakness.  Endo/Heme/Allergies: Negative.   Psychiatric/Behavioral: Negative.        Objective:     BP 102/68   Ht 4\' 11"  (1.499 m)   Wt 117 lb (53.1 kg)   SpO2 98%   BMI 23.63 kg/m  BP Readings from Last 3 Encounters:  11/17/22 102/68  06/30/22 98/64  04/23/22 99/66   Wt Readings from Last 3 Encounters:  11/17/22 117 lb (53.1 kg)  06/30/22 112 lb (50.8 kg)  04/23/22 111 lb 6.4 oz (50.5 kg)      Physical Exam Vitals and nursing note reviewed.  Constitutional:       Appearance: Normal appearance.  HENT:     Head: Normocephalic and atraumatic.     Right Ear: External ear normal.     Left Ear: External ear normal.     Nose: Nose normal.     Mouth/Throat:     Mouth: Mucous membranes are moist.     Pharynx: Oropharynx is clear.  Eyes:     Extraocular Movements: Extraocular movements intact.     Conjunctiva/sclera: Conjunctivae normal.     Pupils: Pupils are equal, round, and reactive to light.  Cardiovascular:     Rate and Rhythm: Normal rate and regular rhythm.     Pulses: Normal pulses.     Heart sounds: Normal heart sounds.  Pulmonary:     Effort: Pulmonary effort is normal.     Breath sounds: Normal breath sounds.  Abdominal:     General: Abdomen is flat. Bowel sounds are normal.     Tenderness: There is no abdominal tenderness.  Musculoskeletal:        General: Normal range of motion.     Cervical back: Normal range of motion and neck supple.  Skin:    General: Skin is warm and dry.  Neurological:     General: No focal deficit present.     Mental Status: She is alert and oriented to person, place, and time.  Psychiatric:        Mood and Affect: Mood normal.        Behavior: Behavior normal.        Thought Content: Thought content normal.        Judgment: Judgment normal.       Assessment & Plan:   Problem List Items Addressed This Visit       Digestive   Gastroesophageal reflux disease without esophagitis   Relevant Medications   omeprazole (PRILOSEC) 20 MG capsule     Other   GAD (generalized anxiety disorder)   Relevant Medications   sertraline (ZOLOFT) 100 MG tablet   Other Relevant Orders   Glucose (CBG) (Completed)   Other Visit Diagnoses     Diarrhea of presumed infectious origin    -  Primary   Relevant Medications   ciprofloxacin (CIPRO) 500 MG tablet   Weakness generalized       History of Helicobacter pylori infection          1. Diarrhea of presumed  infectious origin Trial Cipro.  Patient  education given on supportive care.  Red flags given for prompt reevaluation - ciprofloxacin (CIPRO) 500 MG tablet; Take 1 tablet (500 mg total) by mouth 2 (two) times daily for 5 days.  Dispense: 10 tablet; Refill: 0  2. Weakness generalized Random blood glucose 89.  Weakness more than likely due to extended periods of time without eating.  Encourage patient to increase caloric intake and intervals.  3. GAD (generalized anxiety disorder) Well-controlled, continue current regimen - sertraline (ZOLOFT) 100 MG tablet; Take 1 tablet (100 mg total) by mouth daily.  Dispense: 90 tablet; Refill: 1   4. History of Helicobacter pylori infection   5. Gastroesophageal reflux disease without esophagitis Encouraged to resume.  Patient has upcoming appoint in 2 weeks with gastroenterology.  Encouraged to keep appointment for follow-up. - omeprazole (PRILOSEC) 20 MG capsule; Take 1 capsule (20 mg total) by mouth daily.  Dispense: 30 capsule; Refill: 1   I have reviewed the patient's medical history (PMH, PSH, Social History, Family History, Medications, and allergies) , and have been updated if relevant. I spent 30 minutes reviewing chart and  face to face time with patient.    Return if symptoms worsen or fail to improve.    Kasandra Knudsen Mayers, PA-C

## 2022-11-18 ENCOUNTER — Encounter: Payer: Self-pay | Admitting: Physician Assistant

## 2022-12-02 ENCOUNTER — Other Ambulatory Visit: Payer: Self-pay

## 2022-12-02 ENCOUNTER — Ambulatory Visit (INDEPENDENT_AMBULATORY_CARE_PROVIDER_SITE_OTHER): Payer: Self-pay | Admitting: Internal Medicine

## 2022-12-02 ENCOUNTER — Encounter: Payer: Self-pay | Admitting: Internal Medicine

## 2022-12-02 VITALS — BP 96/60 | HR 65 | Ht 59.0 in | Wt 117.0 lb

## 2022-12-02 DIAGNOSIS — R198 Other specified symptoms and signs involving the digestive system and abdomen: Secondary | ICD-10-CM

## 2022-12-02 DIAGNOSIS — K625 Hemorrhage of anus and rectum: Secondary | ICD-10-CM

## 2022-12-02 DIAGNOSIS — R109 Unspecified abdominal pain: Secondary | ICD-10-CM

## 2022-12-02 DIAGNOSIS — K5909 Other constipation: Secondary | ICD-10-CM

## 2022-12-02 MED ORDER — PLENVU 140 G PO SOLR
1.0000 | Freq: Once | ORAL | 0 refills | Status: AC
  Filled 2022-12-02: qty 3, 1d supply, fill #0

## 2022-12-02 NOTE — Progress Notes (Signed)
HISTORY OF PRESENT ILLNESS:  Alicia Cochran is a 32 y.o. 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  Patient tells me that she has had intermittent bleeding per rectum for the past 3 years.  This is worsened with time.  She has also had abdominal bloating and some weight gain.  Problems, as well, with recurrent left-sided abdominal pain.  1 point she tells me she was diagnosed with a bacteria and treated with antibiotics.  She had left-sided pain as recently as yesterday.  Some nausea.  She describes constipation.  She also describes pain with defecation often associated with bleeding.  Review of blood work from December 30, 2021 shows normal comprehensive metabolic panel and normal CBC with hemoglobin 13.6.  CT scan of the abdomen and pelvis with contrast performed February 2019 to evaluate right lower quadrant pain revealed no acute abnormalities.  She also underwent abdominal ultrasound October 2023.  This was unremarkable.  Normal gallbladder.  She does take omeprazole 20 mg daily.  Also takes NSAIDs.  Her abdominal discomfort is not affected by meals.  REVIEW OF SYSTEMS:  All non-GI ROS negative unless otherwise stated in the HPI except for anxiety, back pain, headaches, itching, muscle cramps, sleeping problems, ankle edema, increased thirst, excessive urination, urinary leakage  Past Medical History:  Diagnosis Date   Anal fissure    Anxiety    Bowel obstruction (HCC)    Cholestasis of pregnancy in third trimester    Chronic headaches    Hx of chlamydia infection    IBS (irritable bowel syndrome)    Ulcerative colitis (HCC)     Past Surgical History:  Procedure Laterality Date   CYSTO WITH HYDRODISTENSION N/A 10/07/2018   Procedure: CYSTOSCOPY/HYDRODISTENSION INSTILL MARCAINE AND PYRIDIUM;  Surgeon: Bjorn Pippin, MD;  Location: [facility];   Service: Urology;  Laterality: N/A;   expanded bladder      Social History Channell Strickfaden  reports that she has never smoked. She has never been exposed to tobacco smoke. She has never used smokeless tobacco. She reports that she does not drink alcohol and does not use drugs.  family history includes Asthma in her mother; Diabetes in her maternal grandmother.  No Known Allergies     PHYSICAL EXAMINATION: Vital signs: BP 96/60   Pulse 65   Ht 4\' 11"  (1.499 m)   Wt 117 lb (53.1 kg)   BMI 23.63 kg/m   Constitutional: generally well-appearing, no acute distress Psychiatric: alert and oriented x3, cooperative Eyes: extraocular movements intact, anicteric, conjunctiva pink Mouth: oral pharynx moist, no lesions Neck: supple no lymphadenopathy Cardiovascular: heart regular rate and rhythm, no murmur Lungs: clear to auscultation bilaterally Abdomen: soft, nontender, nondistended, no obvious ascites, no peritoneal signs, normal bowel sounds, no organomegaly Rectal: Deferred until colonoscopy Extremities: no clubbing, cyanosis, or lower extremity edema bilaterally Skin: no lesions on visible extremities Neuro: No focal deficits.  Cranial nerves intact   ASSESSMENT:  1.  Worsening problems with rectal bleeding, constipation, and abdominal discomfort.  Rule out neoplasia.  Rule out colitis. 2.  Rectal pain often associated with rectal bleeding.  Rule out fissure   PLAN:  1.  Colonoscopy.The nature of the procedure, as well as the risks, benefits, and alternatives were carefully and thoroughly reviewed with the patient. Ample time for discussion and questions allowed. The patient understood, was satisfied, and agreed to proceed. 2.  Further recommendations after  the above A total time of 45 minutes was spent preparing to see the patient, reviewing outside office notes, laboratories, and radiology studies.  Obtaining comprehensive history, performing medically appropriate  physical examination, counseling educating the patient regarding the above listed issues, ordering colonoscopy, and documenting clinical information in the health record

## 2022-12-02 NOTE — Patient Instructions (Signed)
_______________________________________________________  If your blood pressure at your visit was 140/90 or greater, please contact your primary care physician to follow up on this.  _______________________________________________________  If you are age 32 or older, your body mass index should be between 23-30. Your Body mass index is 23.63 kg/m. If this is out of the aforementioned range listed, please consider follow up with your Primary Care Provider.  If you are age 48 or younger, your body mass index should be between 19-25. Your Body mass index is 23.63 kg/m. If this is out of the aformentioned range listed, please consider follow up with your Primary Care Provider.   ________________________________________________________  The [facility] providers would like to encourage you to use [facility] to communicate with providers for non-urgent requests or questions.  Due to long hold times on the telephone, sending your provider a message by [facility] may be a faster and more efficient way to get a response.  Please allow 48 business hours for a response.  Please remember that this is for non-urgent requests.  _______________________________________________________ Bonita Quin have been scheduled for a colonoscopy. Please follow written instructions given to you at your visit today.   Please pick up your prep supplies at the pharmacy within the next 1-3 days.  If you use inhalers (even only as needed), please bring them with you on the day of your procedure.  DO NOT TAKE 7 DAYS PRIOR TO TEST- Trulicity (dulaglutide) Ozempic, Wegovy (semaglutide) Mounjaro (tirzepatide) Bydureon Bcise (exanatide extended release)  DO NOT TAKE 1 DAY PRIOR TO YOUR TEST Rybelsus (semaglutide) Adlyxin (lixisenatide) Victoza (liraglutide) Byetta (exanatide) ___________________________________________________________________________

## 2022-12-08 ENCOUNTER — Other Ambulatory Visit: Payer: Self-pay

## 2022-12-29 ENCOUNTER — Ambulatory Visit (AMBULATORY_SURGERY_CENTER): Payer: Self-pay | Admitting: Internal Medicine

## 2022-12-29 ENCOUNTER — Other Ambulatory Visit: Payer: Self-pay

## 2022-12-29 ENCOUNTER — Encounter: Payer: Self-pay | Admitting: Internal Medicine

## 2022-12-29 VITALS — BP 92/57 | HR 67 | Temp 98.0°F | Resp 12 | Ht 59.0 in | Wt 117.0 lb

## 2022-12-29 DIAGNOSIS — R198 Other specified symptoms and signs involving the digestive system and abdomen: Secondary | ICD-10-CM

## 2022-12-29 DIAGNOSIS — K5909 Other constipation: Secondary | ICD-10-CM

## 2022-12-29 DIAGNOSIS — K625 Hemorrhage of anus and rectum: Secondary | ICD-10-CM

## 2022-12-29 DIAGNOSIS — R109 Unspecified abdominal pain: Secondary | ICD-10-CM

## 2022-12-29 MED ORDER — SODIUM CHLORIDE 0.9 % IV SOLN: 500.0000 mL | Freq: Once | INTRAVENOUS | Status: DC

## 2022-12-29 NOTE — Progress Notes (Signed)
Sedate, gd SR, tolerated procedure well, VSS, report to RN 

## 2022-12-29 NOTE — Patient Instructions (Signed)
-  Continue present medications  Take Citrucel 2 Tablespoons in 12-14 oz of water or juice daily. You can buy at store of choice   YOU HAD AN ENDOSCOPIC PROCEDURE TODAY AT THE [facility]:   Refer to the procedure report that was given to you for any specific questions about what was found during the examination.  If the procedure report does not answer your questions, please call your gastroenterologist to clarify.  If you requested that your care partner not be given the details of your procedure findings, then the procedure report has been included in a sealed envelope for you to review at your convenience later.  YOU SHOULD EXPECT: Some feelings of bloating in the abdomen. Passage of more gas than usual.  Walking can help get rid of the air that was put into your GI tract during the procedure and reduce the bloating. If you had a lower endoscopy (such as a colonoscopy or flexible sigmoidoscopy) you may notice spotting of blood in your stool or on the toilet paper. If you underwent a bowel prep for your procedure, you may not have a normal bowel movement for a few days.  Please Note:  You might notice some irritation and congestion in your nose or some drainage.  This is from the oxygen used during your procedure.  There is no need for concern and it should clear up in a day or so.  SYMPTOMS TO REPORT IMMEDIATELY:  Following lower endoscopy (colonoscopy or flexible sigmoidoscopy):  Excessive amounts of blood in the stool  Significant tenderness or worsening of abdominal pains  Swelling of the abdomen that is new, acute  Fever of 100F or higher  For urgent or emergent issues, a gastroenterologist can be reached at any hour by calling [phone]. Do not use MyChart messaging for urgent concerns.    DIET:  We do recommend a small meal at first, but then you may proceed to your regular diet.  Drink plenty of fluids but you should avoid alcoholic beverages for 24 hours.  ACTIVITY:   You should plan to take it easy for the rest of today and you should NOT DRIVE or use heavy machinery until tomorrow (because of the sedation medicines used during the test).    FOLLOW UP: Our staff will call the number listed on your records the next business day following your procedure.  We will call around 7:15- 8:00 am to check on you and address any questions or concerns that you may have regarding the information given to you following your procedure. If we do not reach you, we will leave a message.     If any biopsies were taken you will be contacted by phone or by letter within the next 1-3 weeks.  Please call us at [phone] if you have not heard about the biopsies in 3 weeks.    SIGNATURES/CONFIDENTIALITY: You and/or your care partner have signed paperwork which will be entered into your electronic medical record.  These signatures attest to the fact that that the information above on your After Visit Summary has been reviewed and is understood.  Full responsibility of the confidentiality of this discharge information lies with you and/or your care-partner.

## 2022-12-29 NOTE — Op Note (Signed)
[facility] Patient Name: Alicia Cochran Procedure Date: 12/29/2022 3:55 PM MRN: [id number] Endoscopist: Wilhemina Bonito. Marina Goodell , MD, [id number] Age: 32 Referring MD:  Date of Birth: 1990/06/08 Gender: Female Account #: [id number] Procedure:                Colonoscopy Indications:              Rectal bleeding, Abdominal bloating pain, Change in                            bowel habits Medicines:                Monitored Anesthesia Care Procedure:                Pre-Anesthesia Assessment:                           - Prior to the procedure, a History and Physical                            was performed, and patient medications and                            allergies were reviewed. The patient's tolerance of                            previous anesthesia was also reviewed. The risks                            and benefits of the procedure and the sedation                            options and risks were discussed with the patient.                            All questions were answered, and informed consent                            was obtained. Prior Anticoagulants: The patient has                            taken no anticoagulant or antiplatelet agents. ASA                            Grade Assessment: I - A normal, healthy patient.                            After reviewing the risks and benefits, the patient                            was deemed in satisfactory condition to undergo the                            procedure.  After obtaining informed consent, the colonoscope                            was passed under direct vision. Throughout the                            procedure, the patient's blood pressure, pulse, and                            oxygen saturations were monitored continuously. The                            Olympus CF-HQ190L [id number]) Colonoscope was                            introduced through the anus and advanced to the the                             cecum, identified by appendiceal orifice and                            ileocecal valve. The ileocecal valve, appendiceal                            orifice, and rectum were photographed. The quality                            of the bowel preparation was excellent. The                            colonoscopy was performed without difficulty. The                            patient tolerated the procedure well. The bowel                            preparation used was SUPREP via split dose                            instruction. Scope In: 4:06:01 PM Scope Out: 4:14:12 PM Scope Withdrawal Time: 0 hours 6 minutes 18 seconds  Total Procedure Duration: 0 hours 8 minutes 11 seconds  Findings:                 The entire examined colon appeared normal on direct                            and retroflexion views.                           Internal hemorrhoids were found during retroflexion. Complications:            No immediate complications. Estimated blood loss:                            None.  Estimated Blood Loss:     Estimated blood loss: none. Impression:               - The entire examined colon is normal on direct and                            retroflexion views.                           - Internal hemorrhoids.                           - No specimens collected. Recommendation:           - Repeat colonoscopy at age 25 for screening                            purposes.                           - Patient has a contact number available for                            emergencies. The signs and symptoms of potential                            delayed complications were discussed with the                            patient. Return to normal activities tomorrow.                            Written discharge instructions were provided to the                            patient.                           - Resume previous diet.                           - Continue  present medications.                           - TAKE CITRUCEL (FORTIFIED FIBER POWDER) 2                            TABLESPOONS IN 12-14 OZ OF WATER OR JUICE DAILY                           - OFFICE FOLLOW UO WITH DR Marina Goodell IN 6-8 WEEKS Layna Roeper N. Marina Goodell, MD 12/29/2022 4:20:46 PM This report has been signed electronically.

## 2022-12-30 ENCOUNTER — Telehealth: Payer: Self-pay

## 2022-12-30 NOTE — Telephone Encounter (Signed)
No answer, left message to call if having any issues or concerns, B.Schwartz RN 

## 2022-12-31 ENCOUNTER — Other Ambulatory Visit: Payer: Self-pay

## 2022-12-31 ENCOUNTER — Telehealth: Payer: Self-pay | Admitting: Internal Medicine

## 2022-12-31 NOTE — Telephone Encounter (Signed)
Inbound call from patient stating hse has not been feeling well since 9/3 colonoscopy. States she has been having abdominal pain and nausea. States she had to call of out of work yesterday and today due to pain. Patient requesting a work note for yesterday, today, and tomorrow. Patient requesting a call back as soon as possible due to being scheduled for work tomorrow 9/6. Please advise, thank you.

## 2023-01-01 NOTE — Telephone Encounter (Signed)
Although the colonoscopy would not cause her symptoms, ok by me to write a one time doctor's note for work excusal for GI symptoms that interfere with her ability to work

## 2023-01-01 NOTE — Telephone Encounter (Signed)
Returned call to patient. Pt already received letter from our office.

## 2023-01-01 NOTE — Telephone Encounter (Signed)
Dr. Tomasa Rand as DOD AM of 9/6 will you please advise on message below in Dr. Lamar Sprinkles absence today?  Pt called into the office again today to follow up on message. Thanks

## 2023-01-01 NOTE — Telephone Encounter (Signed)
Patent called stating that she needed a doctors note stating that she could return to work today.

## 2023-01-07 ENCOUNTER — Other Ambulatory Visit: Payer: Self-pay

## 2023-01-07 ENCOUNTER — Ambulatory Visit: Payer: Self-pay | Attending: Internal Medicine | Admitting: Internal Medicine

## 2023-01-07 ENCOUNTER — Encounter: Payer: Self-pay | Admitting: Internal Medicine

## 2023-01-07 VITALS — BP 98/61 | HR 65 | Temp 98.4°F | Ht 59.0 in | Wt 121.0 lb

## 2023-01-07 DIAGNOSIS — N911 Secondary amenorrhea: Secondary | ICD-10-CM

## 2023-01-07 DIAGNOSIS — F411 Generalized anxiety disorder: Secondary | ICD-10-CM

## 2023-01-07 DIAGNOSIS — J302 Other seasonal allergic rhinitis: Secondary | ICD-10-CM

## 2023-01-07 DIAGNOSIS — R635 Abnormal weight gain: Secondary | ICD-10-CM

## 2023-01-07 DIAGNOSIS — K5909 Other constipation: Secondary | ICD-10-CM

## 2023-01-07 DIAGNOSIS — Z2821 Immunization not carried out because of patient refusal: Secondary | ICD-10-CM

## 2023-01-07 DIAGNOSIS — R140 Abdominal distension (gaseous): Secondary | ICD-10-CM

## 2023-01-07 MED ORDER — LORATADINE 10 MG PO TABS
10.0000 mg | ORAL_TABLET | Freq: Every day | ORAL | 3 refills | Status: DC
Start: 2023-01-07 — End: 2023-03-02
  Filled 2023-01-07: qty 30, 30d supply, fill #0
  Filled 2023-02-17: qty 30, 30d supply, fill #1

## 2023-01-07 NOTE — Patient Instructions (Addendum)
Try to eat some green leafy vegetables every day.  Eat 3 servings of fruits every day and at least a green leafy vegetable every day.  Purchase prunes and eat some prunes about twice a week. Purchase hydrocortisone cream 1% over-the-counter.  Use a little on a Q-tip to put in the ears about once a week to help decrease the itching.  Try decreasing Zoloft to 50 mg daily as was discussed today to see whether this is affecting your weight.

## 2023-01-07 NOTE — Progress Notes (Signed)
Patient ID: Alicia Cochran, female    DOB: Apr 05, 1991  MRN: [id number]  CC: Depression (Depression / anxiety f/u. [place] pain / itchiness X6 mo, itchy throat/Bloating, rapid weight gain, unable to sleep x4 mo/Yes to pap for another appt)   Subjective: Alicia Cochran is a 32 y.o. female who presents for chronic ds management. Her concerns today include:  Pt with hx of MDD/GAD, dyspepsia   C/o pain BL ears x 2 wks Intermittent, sharp and last for few seconds.  Feels like water in ears Uses Q-tip to clean.  No drainage Itchy throat and ears x 6 mths. No taking anything for symptoms.  Uses OTC wax softener   Concern about wgh gain since May-June of this yr.  Went from 112 lbs last fall  to now 121 lbs -feels her portions are not large, drinks  a lot of water.  Eats a lot of fruits daily; eats veggie but not daily Feels she is active but does not exercise regularly Started on Zoloft in September of last year. On BCP for few yrs.  Menses sometimes only last 1 day.  Does not recall when her last period was,  would like to have a pregnancy test  Still gets a lot of bloating after meals; not able to eat a full meal at times because she feels full and bloated Using Citrucel Powder; told to use daily for 14 days. BM still not regular.  Last BM was 4 days ago.  Has f/u appt with GI later this mth.   Recent C-scope normal  MDD/GAD:  doing well on Zoloft and Trazodone.  Started on Zoloft 12/2021 Patient Active Problem List   Diagnosis Date Noted   Gastroesophageal reflux disease without esophagitis 03/09/2022   Pelvic pain in female 07/30/2017   GAD (generalized anxiety disorder) 06/04/2017   History of ovarian cyst 06/04/2017   Ovarian cyst 01/01/2017     Current Outpatient Medications on File Prior to Visit  Medication Sig Dispense Refill   fluticasone (FLONASE) 50 MCG/ACT nasal spray Place 2 sprays into both nostrils daily. 16 g 6   hydrOXYzine (ATARAX) 25 MG tablet Take  0.5-1 tablets (12.5-25 mg total) by mouth for feelings of severe anxiety up to 3 times daily.  (Use sparingly) 270 tablet 0   ibuprofen (ADVIL) 600 MG tablet Take 1 tablet (600 mg total) by mouth every 8 (eight) hours as needed. 30 tablet 0   omeprazole (PRILOSEC) 20 MG capsule Take 1 capsule (20 mg total) by mouth daily. 30 capsule 1   sertraline (ZOLOFT) 100 MG tablet Take 1 tablet (100 mg total) by mouth daily. 90 tablet 1   traZODone (DESYREL) 50 MG tablet Take 1-2 tablets (50-100 mg total) by mouth at bedtime as needed for insomnia. 60 tablet 5   No current facility-administered medications on file prior to visit.    No Known Allergies  Social History   Socioeconomic History   Marital status: Single    Spouse name: Not on file   Number of children: 1   Years of education: Not on file   Highest education level: Not on file  Occupational History   Occupation: server  Tobacco Use   Smoking status: Never    Passive exposure: Never   Smokeless tobacco: Never  Vaping Use   Vaping status: Never Used  Substance and Sexual Activity   Alcohol use: No    Alcohol/week: 0.0 standard drinks of alcohol   Drug use: No   Sexual activity:  Yes  Other Topics Concern   Not on file  Social History Narrative   ** Merged History Encounter **       Social Determinants of Health   Financial Resource Strain: Not on file  Food Insecurity: Not on file  Transportation Needs: Not on file  Physical Activity: Not on file  Stress: Not on file  Social Connections: Not on file  Intimate Partner Violence: Not on file    Family History  Problem Relation Age of Onset   Asthma Mother    Diabetes Maternal Grandmother    Alcohol abuse Neg Hx    Arthritis Neg Hx    Birth defects Neg Hx    Cancer Neg Hx    COPD Neg Hx    Depression Neg Hx    Drug abuse Neg Hx    Early death Neg Hx    Hearing loss Neg Hx    Heart disease Neg Hx    Hyperlipidemia Neg Hx    Hypertension Neg Hx    Kidney  disease Neg Hx    Learning disabilities Neg Hx    Mental illness Neg Hx    Mental retardation Neg Hx    Miscarriages / Stillbirths Neg Hx    Stroke Neg Hx    Vision loss Neg Hx    Varicose Veins Neg Hx     Past Surgical History:  Procedure Laterality Date   CYSTO WITH HYDRODISTENSION N/A 10/07/2018   Procedure: CYSTOSCOPY/HYDRODISTENSION INSTILL MARCAINE AND PYRIDIUM;  Surgeon: Bjorn Pippin, MD;  Location: [facility];  Service: Urology;  Laterality: N/A;   expanded bladder      ROS: Review of Systems Negative except as stated above  PHYSICAL EXAM: BP 98/61 (BP Location: Left Arm, Patient Position: Sitting, Cuff Size: Normal)   Pulse 65   Temp 98.4 F (36.9 C) (Oral)   Ht 4\' 11"  (1.499 m)   Wt 121 lb (54.9 kg)   LMP  (LMP Unknown)   SpO2 99%   BMI 24.44 kg/m   Wt Readings from Last 3 Encounters:  01/07/23 121 lb (54.9 kg)  12/29/22 117 lb (53.1 kg)  12/02/22 117 lb (53.1 kg)    Physical Exam  General appearance - alert, well appearing, young female and in no distress Mental status - normal mood, behavior, speech, dress, motor activity, and thought processes Neck - supple, no significant adenopathy.  No thyroid enlargement Ears: Both ear canal and tympanic membrane within normal limits. Mouth: Throat without erythema or exudates Chest - clear to auscultation, no wheezes, rales or rhonchi, symmetric air entry Heart - normal rate, regular rhythm, normal S1, S2, no murmurs, rubs, clicks or gallops Abdomen: Normal bowel sounds, nondistended, soft, nontender, no organomegaly     01/07/2023    2:51 PM 11/17/2022   11:11 AM 06/30/2022    9:33 AM  Depression screen PHQ 2/9  Decreased Interest 1 1 1   Down, Depressed, Hopeless 0 0 1  PHQ - 2 Score 1 1 2   Altered sleeping 2 1 1   Tired, decreased energy 2 1 1   Change in appetite 2 1 0  Feeling bad or failure about yourself  0 0 0  Trouble concentrating 0 0 0  Moving slowly or fidgety/restless 0 0 0  Suicidal thoughts 0 0 0   PHQ-9 Score 7 4 4   Difficult doing work/chores  Not difficult at all        Latest Ref Rng & Units 12/30/2021   11:31 AM 11/10/2016  4:32 PM 10/06/2016   12:15 PM  CMP  Glucose 70 - 99 mg/dL 79  76  85   BUN 6 - 20 mg/dL 14  11  7    Creatinine 0.57 - 1.00 mg/dL 4.33  2.95  1.88   Sodium 134 - 144 mmol/L 141  141  140   Potassium 3.5 - 5.2 mmol/L 4.1  4.0  3.8   Chloride 96 - 106 mmol/L 105  102  104   CO2 20 - 29 mmol/L  23  24   Calcium 8.7 - 10.2 mg/dL 9.3  9.3  8.9   Total Protein 6.0 - 8.5 g/dL 6.4  6.8  6.6   Total Bilirubin 0.0 - 1.2 mg/dL 0.2  0.3  0.2   Alkaline Phos 44 - 121 IU/L 48  50  60   AST 0 - 40 IU/L 16  21  13    ALT 0 - 32 IU/L  39  11    Lipid Panel  No results found for: "CHOL", "TRIG", "HDL", "CHOLHDL", "VLDL", "LDLCALC", "LDLDIRECT"  CBC    Component Value Date/Time   WBC 6.6 12/30/2021 1131   WBC 11.0 (H) 12/11/2014 0536   RBC 4.17 12/30/2021 1131   RBC 3.72 (L) 12/11/2014 0536   HGB 13.6 12/30/2021 1131   HCT 38.9 12/30/2021 1131   PLT 209 12/30/2021 1131   MCV 93 12/30/2021 1131   MCH 32.6 12/30/2021 1131   MCH 32.3 12/11/2014 0536   MCHC 35.0 12/30/2021 1131   MCHC 34.1 12/11/2014 0536   RDW 12.8 12/30/2021 1131   LYMPHSABS 1.5 12/30/2021 1131   EOSABS 0.0 12/30/2021 1131   BASOSABS 0.0 12/30/2021 1131   Results for orders placed or performed in visit on 11/17/22  Glucose (CBG)  Result Value Ref Range   POC Glucose 89 70 - 99 mg/dl   POC pregnancy test negative  ASSESSMENT AND PLAN: 1. Seasonal allergies I recommend trying Claritin daily. Recommend purchasing hydrocortisone cream 1% over-the-counter and using a little on a Q-tip about once a week and rubbing it at the opening of the ear canal - loratadine (CLARITIN) 10 MG tablet; Take 1 tablet (10 mg total) by mouth daily.  Dispense: 30 tablet; Refill: 3  2. Weight gain Discussed healthy eating habits.  Encouraged her to get in at least 3 servings of fruits daily and to  incorporate green leafy vegetables into her diet. Discussed and encourage some form of moderate intensity exercise with a goal of 150 minutes/week total. -Discussed with her that Zoloft can cause some weight gain.  She is currently stable on Zoloft 100 mg and feels that her symptoms are in remission.  I told her that she can try cutting the Zoloft in half to 50 mg daily.  If weight gain continues or weight does not stabilize, then we can try changing her to Wellbutrin instead. - TSH+T4F+T3Free  3. Other constipation Encourage her to increase fiber in the diet.  Increase intake of fresh fruits and vegetables especially green leafy vegetables which are high in fiber.  Also recommend eating some prunes a few times a week.  4. Bloating symptom Keep upcoming appointment with GI  5. Secondary amenorrhea Point-of-care pregnancy test negative - POCT urine pregnancy  6. Influenza vaccination declined  7/ GAD  See discussion above regarding Zoloft. Patient was given the opportunity to ask questions.  Patient verbalized understanding of the plan and was able to repeat key elements of the plan.   This documentation was  completed using Paediatric nurse.  Any transcriptional errors are unintentional.  No orders of the defined types were placed in this encounter.    Requested Prescriptions    No prescriptions requested or ordered in this encounter    No follow-ups on file.  Jonah Blue, MD, FACP

## 2023-01-08 LAB — TSH+T4F+T3FREE
Free T4: 1.15 ng/dL (ref 0.82–1.77)
T3, Free: 3 pg/mL (ref 2.0–4.4)
TSH: 1.2 u[IU]/mL (ref 0.450–4.500)

## 2023-01-08 LAB — POCT URINE PREGNANCY: Preg Test, Ur: NEGATIVE

## 2023-01-13 ENCOUNTER — Ambulatory Visit: Payer: Self-pay | Admitting: Physician Assistant

## 2023-01-13 ENCOUNTER — Other Ambulatory Visit: Payer: Self-pay

## 2023-01-13 VITALS — BP 104/71 | HR 69 | Ht 59.0 in | Wt 121.0 lb

## 2023-01-13 DIAGNOSIS — H65191 Other acute nonsuppurative otitis media, right ear: Secondary | ICD-10-CM

## 2023-01-13 DIAGNOSIS — F411 Generalized anxiety disorder: Secondary | ICD-10-CM

## 2023-01-13 MED ORDER — AMOXICILLIN 875 MG PO TABS
875.0000 mg | ORAL_TABLET | Freq: Two times a day (BID) | ORAL | 0 refills | Status: AC
Start: 2023-01-13 — End: 2023-01-18
  Filled 2023-01-13: qty 10, 5d supply, fill #0

## 2023-01-13 MED ORDER — ESCITALOPRAM OXALATE 5 MG PO TABS
5.0000 mg | ORAL_TABLET | Freq: Every day | ORAL | 0 refills | Status: AC
Start: 2023-01-13 — End: ?
  Filled 2023-01-13: qty 30, 30d supply, fill #0

## 2023-01-13 NOTE — Patient Instructions (Addendum)
Please check on [facility] and [facility] to see if they offer patient assistance so you can get this testing completed.  You are going to start Lexapro 5 mg once daily.  You will continue trazodone as directed.  You will follow-up with the mobile unit in approximately 3 weeks, we will call you to notify you of our location.  You are going to take amoxicillin twice daily for 5 days.  I do encourage you to continue using the allergy medication as well.  Roney Jaffe, PA-C Physician Assistant [facility] [email or web address]   Otitis Media, Adult  Otitis media occurs when there is inflammation and fluid in the middle ear with signs and symptoms of an acute infection. The middle ear is a part of the ear that contains bones for hearing as well as air that helps send sounds to the brain. When infected fluid builds up in this space, it causes pressure and can lead to an ear infection. The eustachian tube connects the middle ear to the back of the nose (nasopharynx) and normally allows air into the middle ear. If the eustachian tube becomes blocked, fluid can build up and become infected. What are the causes? This condition is caused by a blockage in the eustachian tube. This can be caused by mucus or by swelling of the tube. Problems that can cause a blockage include: A cold or other upper respiratory infection. Allergies. An irritant, such as tobacco smoke. Enlarged adenoids. The adenoids are areas of soft tissue located high in the back of the throat, behind the nose and the roof of the mouth. They are part of the body's defense system (immune system). A mass in the nasopharynx. Damage to the ear caused by pressure changes (barotrauma). What increases the risk? You are more likely to develop this condition if you: Smoke or are exposed to tobacco smoke. Have an opening in the roof of your mouth (cleft palate). Have gastroesophageal  reflux. Have an immune system disorder. What are the signs or symptoms? Symptoms of this condition include: Ear pain. Fever. Decreased hearing. Tiredness (lethargy). Fluid leaking from the ear, if the eardrum is ruptured or has burst. Ringing in the ear. How is this diagnosed?  This condition is diagnosed with a physical exam. During the exam, your health care provider will use an instrument called an otoscope to look in your ear and check for redness, swelling, and fluid. He or she will also ask about your symptoms. Your health care provider may also order tests, such as: A pneumatic otoscopy. This is a test to check the movement of the eardrum. It is done by squeezing a small amount of air into the ear. A tympanogram. This is a test that shows how well the eardrum moves in response to air pressure in the ear canal. It provides a graph for your health care provider to review. How is this treated? This condition can go away on its own within 3-5 days. But if the condition is caused by a bacterial infection and does not go away on its own, or if it keeps coming back, your health care provider may: Prescribe antibiotic medicine to treat the infection. Prescribe or recommend medicines to control pain. Follow these instructions at home: Take over-the-counter and prescription medicines only as told by your health care provider. If you were prescribed an antibiotic medicine, take it as told by your health care provider. Do not stop taking the antibiotic even if you start to feel better. Keep  all follow-up visits. This is important. Contact a health care provider if: You have bleeding from your nose. There is a lump on your neck. You are not feeling better in 5 days. You feel worse instead of better. Get help right away if: You have severe pain that is not controlled with medicine. You have swelling, redness, or pain around your ear. You have stiffness in your neck. A part of your face is  not moving (paralyzed). The bone behind your ear (mastoid bone) is tender when you touch it. You develop a severe headache. Summary Otitis media is redness, soreness, and swelling of the middle ear, usually resulting in pain and decreased hearing. This condition can go away on its own within 3-5 days. If the problem does not go away in 3-5 days, your health care provider may give you medicines to treat the infection. If you were prescribed an antibiotic medicine, take it as told by your health care provider. Follow all instructions that were given to you by your health care provider. This information is not intended to replace advice given to you by your health care provider. Make sure you discuss any questions you have with your health care provider. Document Revised: 07/22/2020 Document Reviewed: 07/22/2020 Elsevier Patient Education  2024 [employer].

## 2023-01-13 NOTE — Progress Notes (Unsigned)
Established Patient Office Visit  Subjective   Patient ID: Alicia Cochran, female    DOB: 03/11/1991  Age: 32 y.o. MRN: [id number]  Chief Complaint  Patient presents with   Medication Problem    Recently stopped taking Zoloft medication, she c/o of increase in anxiety and aggravation    Ear Pain    States that she stopped taking Zoloft on her own on Wednesday.  States that she was concerned that the Zoloft was causing her to have weight gain and causing her GI issues.  States that her anxiety did increase and she restarted the Zoloft at 50 mg on Sunday and has taken it for the past 3 days.  Has not taken a dose today.  States that she did not notice an improvement in her anxiety but did notice her GI symptoms began to return where she noticed they were much improved after stopping the Zoloft.  States that she also has been experiencing right ear pain.  States this has been ongoing for the past two weeks states that she has been using Claritin without relief.  Denies any other upper respiratory symptoms.    Past Medical History:  Diagnosis Date   Anal fissure    Anxiety    Bowel obstruction (HCC)    Cholestasis of pregnancy in third trimester    Chronic headaches    Hx of chlamydia infection    IBS (irritable bowel syndrome)    Ulcerative colitis (HCC)    Social History   Socioeconomic History   Marital status: Single    Spouse name: Not on file   Number of children: 1   Years of education: Not on file   Highest education level: Not on file  Occupational History   Occupation: server  Tobacco Use   Smoking status: Never    Passive exposure: Never   Smokeless tobacco: Never  Vaping Use   Vaping status: Never Used  Substance and Sexual Activity   Alcohol use: No    Alcohol/week: 0.0 standard drinks of alcohol   Drug use: No   Sexual activity: Yes  Other Topics Concern   Not on file  Social History Narrative   ** Merged History Encounter **       Social  Determinants of Health   Financial Resource Strain: Not on file  Food Insecurity: Not on file  Transportation Needs: Not on file  Physical Activity: Not on file  Stress: Not on file  Social Connections: Not on file  Intimate Partner Violence: Not on file   Family History  Problem Relation Age of Onset   Asthma Mother    Diabetes Maternal Grandmother    Alcohol abuse Neg Hx    Arthritis Neg Hx    Birth defects Neg Hx    Cancer Neg Hx    COPD Neg Hx    Depression Neg Hx    Drug abuse Neg Hx    Early death Neg Hx    Hearing loss Neg Hx    Heart disease Neg Hx    Hyperlipidemia Neg Hx    Hypertension Neg Hx    Kidney disease Neg Hx    Learning disabilities Neg Hx    Mental illness Neg Hx    Mental retardation Neg Hx    Miscarriages / Stillbirths Neg Hx    Stroke Neg Hx    Vision loss Neg Hx    Varicose Veins Neg Hx    No Known Allergies  Review of Systems  Constitutional:  Negative for chills and fever.  HENT:  Positive for ear pain. Negative for sinus pain and sore throat.   Eyes: Negative.   Respiratory:  Negative for shortness of breath.   Cardiovascular:  Negative for chest pain.  Gastrointestinal: Negative.   Genitourinary: Negative.   Musculoskeletal: Negative.   Skin: Negative.   Neurological: Negative.   Endo/Heme/Allergies: Negative.   Psychiatric/Behavioral:  Negative for depression. The patient is nervous/anxious and has insomnia.       Objective:     BP 104/71 (BP Location: Left Arm, Patient Position: Sitting, Cuff Size: Normal)   Pulse 69   Ht 4\' 11"  (1.499 m)   Wt 121 lb (54.9 kg)   LMP  (LMP Unknown)   SpO2 97%   BMI 24.44 kg/m  BP Readings from Last 3 Encounters:  01/13/23 104/71  01/07/23 98/61  12/29/22 (!) 92/57   Wt Readings from Last 3 Encounters:  01/13/23 121 lb (54.9 kg)  01/07/23 121 lb (54.9 kg)  12/29/22 117 lb (53.1 kg)    Physical Exam Vitals and nursing note reviewed.  HENT:     Head: Normocephalic.     Salivary  Glands: Right salivary gland is not diffusely enlarged or tender. Left salivary gland is not diffusely enlarged or tender.     Right Ear: Tympanic membrane is injected, erythematous and bulging.     Left Ear: Tympanic membrane, ear canal and external ear normal.     Nose: Nose normal.     Right Turbinates: Not enlarged or swollen.     Left Turbinates: Not enlarged or swollen.     Right Sinus: No maxillary sinus tenderness or frontal sinus tenderness.     Left Sinus: No maxillary sinus tenderness or frontal sinus tenderness.     Mouth/Throat:     Mouth: Mucous membranes are moist.     Pharynx: Oropharynx is clear.  Eyes:     Conjunctiva/sclera: Conjunctivae normal.     Pupils: Pupils are equal, round, and reactive to light.  Cardiovascular:     Rate and Rhythm: Normal rate and regular rhythm.     Pulses: Normal pulses.     Heart sounds: Normal heart sounds.  Pulmonary:     Effort: Pulmonary effort is normal.     Breath sounds: Normal breath sounds.  Musculoskeletal:        General: Normal range of motion.     Cervical back: Normal range of motion and neck supple.  Skin:    General: Skin is warm and dry.  Neurological:     General: No focal deficit present.     Mental Status: She is oriented to person, place, and time.  Psychiatric:        Mood and Affect: Mood normal.        Thought Content: Thought content normal.        Judgment: Judgment normal.        Assessment & Plan:   Problem List Items Addressed This Visit       Other   GAD (generalized anxiety disorder) - Primary   Relevant Medications   escitalopram (LEXAPRO) 5 MG tablet   Other Visit Diagnoses     Other non-recurrent acute nonsuppurative otitis media of right ear       Relevant Medications   amoxicillin (AMOXIL) 875 MG tablet      1. GAD (generalized anxiety disorder) Patient declines continuing Zoloft and adding Wellbutrin, patient would like to do a trial of Lexapro.  Trial Lexapro 5 mg, patient  education given on coping skills.  Red flags given for prompt reevaluation.  Patient to follow-up with the mobile unit in 3 weeks. - escitalopram (LEXAPRO) 5 MG tablet; Take 1 tablet (5 mg total) by mouth daily.  Dispense: 30 tablet; Refill: 0  2. Other non-recurrent acute nonsuppurative otitis media of right ear Trial amoxicillin.  Patient education given on supportive care. - amoxicillin (AMOXIL) 875 MG tablet; Take 1 tablet (875 mg total) by mouth 2 (two) times daily for 5 days.  Dispense: 10 tablet; Refill: 0    I have reviewed the patient's medical history (PMH, PSH, Social History, Family History, Medications, and allergies) , and have been updated if relevant. I spent 30 minutes reviewing chart and  face to face time with patient.   Return in about 3 weeks (around 02/03/2023) for With [facility].    Kasandra Knudsen Mayers, PA-C

## 2023-01-14 ENCOUNTER — Encounter: Payer: Self-pay | Admitting: Physician Assistant

## 2023-01-21 ENCOUNTER — Encounter: Payer: Self-pay | Admitting: Physician Assistant

## 2023-01-27 ENCOUNTER — Ambulatory Visit: Payer: Self-pay | Admitting: *Deleted

## 2023-01-27 NOTE — Telephone Encounter (Signed)
Patient scheduled VV °

## 2023-01-27 NOTE — Telephone Encounter (Signed)
Reason for Disposition  [1] Started on anti-anxiety medication AND [2] no relief    Started on Lexapro 2 weeks ago when seen on the [facility].  Answer Assessment - Initial Assessment Questions 1. CONCERN: "Did anything happen that prompted you to call today?"      I'm having headaches, I'm anxious, I'm scared.   I feel like I'm in a bad mood all the time.    I've had anxiety in the past.   I was on Zoloft but it was causing stomach problems.   So they put me on Lexapro 2 weeks ago 2 mg.   She mentioned taking it with Wellbutrin but I didn't want to do it.   I see her next week.   I saw her at the mobile unit.   Sherlean Foot saw me.    I was feeling better on the Zoloft.     The symptoms started with the Lexapro.   I'm having trouble falling asleep too.   This all started with the Lexapro.   2. ANXIETY SYMPTOMS: "Can you describe how you (your loved one; patient) have been feeling?" (e.g., tense, restless, panicky, anxious, keyed up, overwhelmed, sense of impending doom).      I don't feel like myself since starting the Lexapro  3. ONSET: "How long have you been feeling this way?" (e.g., hours, days, weeks)     When I started the Lexapro 2 weeks ago 4. SEVERITY: "How would you rate the level of anxiety?" (e.g., 0 - 10; or mild, moderate, severe).     It's pretty bad.   I don't feel like myself.   I'm irritable too. 5. FUNCTIONAL IMPAIRMENT: "How have these feelings affected your ability to do daily activities?" "Have you had more difficulty than usual doing your normal daily activities?" (e.g., getting better, same, worse; self-care, school, work, interactions)     Yes I'm in a bad mood all the time. 6. HISTORY: "Have you felt this way before?" "Have you ever been diagnosed with an anxiety problem in the past?" (e.g., generalized anxiety disorder, panic attacks, PTSD). If Yes, ask: "How was this problem treated?" (e.g., medicines, counseling, etc.)     I've had anxiety before that's why I  was on the Zoloft but it was giving me stomach problems so I had to come off of it.   That's when she put me on the Lexapro. 7. RISK OF HARM - SUICIDAL IDEATION: "Do you ever have thoughts of hurting or killing yourself?" If Yes, ask:  "Do you have these feelings now?" "Do you have a plan on how you would do this?"     Not asked at this point. 8. TREATMENT:  "What has been done so far to treat this anxiety?" (e.g., medicines, relaxation strategies). "What has helped?"     Nothing 9. TREATMENT - THERAPIST: "Do you have a counselor or therapist? Name?"     Not asked 10. POTENTIAL TRIGGERS: "Do you drink caffeinated beverages (e.g., coffee, colas, teas), and how much daily?" "Do you drink alcohol or use any drugs?" "Have you started any new medicines recently?"       Not asked 11. PATIENT SUPPORT: "Who is with you now?" "Who do you live with?" "Do you have family or friends who you can talk to?"        Not asked since the Lexapro is triggering these symptoms. 12. OTHER SYMPTOMS: "Do you have any other symptoms?" (e.g., feeling depressed, trouble concentrating, trouble sleeping,  trouble breathing, palpitations or fast heartbeat, chest pain, sweating, nausea, or diarrhea)       Irritable, trouble falling asleep. In a bad mood all the time, I feel scared and very anxious. 13. PREGNANCY: "Is there any chance you are pregnant?" "When was your last menstrual period?"       Not asked  Protocols used: Anxiety and Panic Attack-A-AH

## 2023-01-27 NOTE — Telephone Encounter (Signed)
  Chief Complaint: Anxious, scared, irritable, and in a bad mood all the time since starting the Lexapro.  On 01/13/2023 she was seen by Maurene Capes, PA-C on the [facility].  "Cari said she would call me and tell me where they were going to be next week so I could come see her again". Symptoms: The Lexapro is making me not feel like myself.   I felt better on the Zoloft except for the stomach problems it caused. Frequency: Daily Pertinent Negatives: Patient denies N/A Disposition: [] ED /[] Urgent Care (no appt availability in office) / [] Appointment(In office/virtual)/ []  [facility] [facility]/ [] Home Care/ [] Refused Recommended Disposition /[] [facility]/ [x]  Follow-up with PCP Additional Notes: Message sent to [facility], PA-C letting her know what is going on.    Pt was agreeable to this plan.

## 2023-01-28 ENCOUNTER — Telehealth: Payer: Self-pay | Admitting: Physician Assistant

## 2023-01-28 ENCOUNTER — Other Ambulatory Visit: Payer: Self-pay

## 2023-01-28 ENCOUNTER — Encounter: Payer: Self-pay | Admitting: Physician Assistant

## 2023-01-28 DIAGNOSIS — F411 Generalized anxiety disorder: Secondary | ICD-10-CM

## 2023-01-28 MED ORDER — TRAZODONE HCL 50 MG PO TABS
50.0000 mg | ORAL_TABLET | Freq: Every evening | ORAL | 5 refills | Status: DC | PRN
Start: 2023-01-28 — End: 2023-03-02
  Filled 2023-01-28: qty 60, 30d supply, fill #0

## 2023-01-28 MED ORDER — VENLAFAXINE HCL ER 37.5 MG PO CP24
37.5000 mg | ORAL_CAPSULE | Freq: Every day | ORAL | 1 refills | Status: DC
Start: 2023-01-28 — End: 2023-03-02
  Filled 2023-01-28: qty 30, 30d supply, fill #0

## 2023-01-28 NOTE — Progress Notes (Signed)
Established Patient Office Visit  Subjective   Patient ID: Alicia Cochran, female    DOB: 11/14/1990  Age: 32 y.o. MRN: [id number]  Chief Complaint  Patient presents with   Anxiety   Virtual Visit via Video Note  I connected with Juan Quam on 01/28/23 at  9:00 AM EDT by a video enabled telemedicine application and verified that I am speaking with the correct person using two identifiers.  Location: Patient: home Provider: [facility]    I discussed the limitations of evaluation and management by telemedicine and the availability of in person appointments. The patient expressed understanding and agreed to proceed.  History of Present Illness: States that she did start the Lexapro, but unfortunately she does not like the way that it makes her feel.  States that it is causing her to have abdominal discomfort, and states that it is not offering any relief from her anxiety, irritability and feels that she is also starting to have mood swings.  States that she is also having difficulty sleeping despite taking the trazodone 50 mg.  States that if she is still awake after 1 hour of taking the trazodone 50 mg she will take a second tablet with relief.   Observations/Objective: Medical history and current medications reviewed, no physical exam completed     Past Medical History:  Diagnosis Date   Anal fissure    Anxiety    Bowel obstruction (HCC)    Cholestasis of pregnancy in third trimester    Chronic headaches    Hx of chlamydia infection    IBS (irritable bowel syndrome)    Ulcerative colitis (HCC)    Social History   Socioeconomic History   Marital status: Single    Spouse name: Not on file   Number of children: 1   Years of education: Not on file   Highest education level: Not on file  Occupational History   Occupation: server  Tobacco Use   Smoking status: Never    Passive exposure: Never   Smokeless tobacco: Never   Vaping Use   Vaping status: Never Used  Substance and Sexual Activity   Alcohol use: No    Alcohol/week: 0.0 standard drinks of alcohol   Drug use: No   Sexual activity: Yes  Other Topics Concern   Not on file  Social History Narrative   ** Merged History Encounter **       Social Determinants of Health   Financial Resource Strain: Not on file  Food Insecurity: Not on file  Transportation Needs: Not on file  Physical Activity: Not on file  Stress: Not on file  Social Connections: Not on file  Intimate Partner Violence: Not on file   Family History  Problem Relation Age of Onset   Asthma Mother    Diabetes Maternal Grandmother    Alcohol abuse Neg Hx    Arthritis Neg Hx    Birth defects Neg Hx    Cancer Neg Hx    COPD Neg Hx    Depression Neg Hx    Drug abuse Neg Hx    Early death Neg Hx    Hearing loss Neg Hx    Heart disease Neg Hx    Hyperlipidemia Neg Hx    Hypertension Neg Hx    Kidney disease Neg Hx    Learning disabilities Neg Hx    Mental illness Neg Hx    Mental retardation Neg Hx    Miscarriages / Stillbirths Neg Hx  Stroke Neg Hx    Vision loss Neg Hx    Varicose Veins Neg Hx    No Known Allergies  Review of Systems  Constitutional: Negative.   HENT: Negative.    Eyes: Negative.   Respiratory:  Negative for shortness of breath.   Cardiovascular:  Negative for chest pain.  Gastrointestinal: Negative.  Negative for nausea and vomiting.  Genitourinary: Negative.   Musculoskeletal: Negative.   Skin: Negative.   Neurological: Negative.   Endo/Heme/Allergies: Negative.   Psychiatric/Behavioral:  Negative for depression. The patient is nervous/anxious and has insomnia.         Assessment & Plan:   Problem List Items Addressed This Visit       Other   GAD (generalized anxiety disorder)   Relevant Medications   venlafaxine XR (EFFEXOR XR) 37.5 MG 24 hr capsule   traZODone (DESYREL) 50 MG tablet   Assessment and Plan: 1. GAD  (generalized anxiety disorder) Stop Lexapro, trial Effexor.  Patient education given on supportive care.  Patient encouraged to increase trazodone 100 mg for next few nights.  Red flags given for prompt reevaluation.  Patient to follow-up with mobile unit in approximately 3 weeks. - venlafaxine XR (EFFEXOR XR) 37.5 MG 24 hr capsule; Take 1 capsule (37.5 mg total) by mouth daily with breakfast.  Dispense: 30 capsule; Refill: 1 - traZODone (DESYREL) 50 MG tablet; Take 1-2 tablets (50-100 mg total) by mouth at bedtime as needed for insomnia.  Dispense: 60 tablet; Refill: 5   Follow Up Instructions:    I discussed the assessment and treatment plan with the patient. The patient was provided an opportunity to ask questions and all were answered. The patient agreed with the plan and demonstrated an understanding of the instructions.   The patient was advised to call back or seek an in-person evaluation if the symptoms worsen or if the condition fails to improve as anticipated.  I provided 20 minutes of non-face-to-face time during this encounter.   Return in about 3 weeks (around 02/18/2023) for With [facility].    Kasandra Knudsen Mayers, PA-C

## 2023-01-28 NOTE — Patient Instructions (Signed)
You are going to discontinue the Lexapro and do a trial of Effexor.  I do encourage you to increase your trazodone 100 mg over the next few nights until your sleep pattern improves.  You will follow-up with the mobile unit in approximately 3 weeks.  We will call you at the beginning of that week to remind.  In the meantime to let us know if anything we can do for you  Roney Jaffe, PA-C Physician Assistant [facility] [email or web address]  Venlafaxine Extended-Release Capsules What is this medication? VENLAFAXINE (VEN la fax een) treats depression and anxiety. It increases the amount of serotonin and norepinephrine in the brain, hormones that help regulate mood. It belongs to a group of medications called SNRIs. This medicine may be used for other purposes; ask your health care provider or pharmacist if you have questions. COMMON BRAND NAME(S): Effexor XR What should I tell my care team before I take this medication? They need to know if you have any of these conditions: Bleeding disorders Glaucoma Heart disease High blood pressure High cholesterol Kidney disease Liver disease Low levels of sodium in the blood Mania or bipolar disorder Seizures Suicidal thoughts, plans, or attempt by you or a family member Take medications that treat or prevent blood clots Thyroid disease An unusual or allergic reaction to venlafaxine, other medications, foods, dyes, or preservatives Pregnant or trying to get pregnant Breastfeeding How should I use this medication? Take this medication by mouth with a full glass of water. Take it as directed on the prescription label. Do not cut, crush, or chew this medication. Take it with food. You may open the capsule and put the contents in 1 teaspoon of applesauce. Swallow the medication and applesauce right away. Do not chew the medication or applesauce. Follow with a glass of water to ensure complete  swallowing of the pellets. Try to take your medication at about the same time each day. Do not take your medication more often than directed. Keep taking this medication unless your care team tells you to stop. Stopping it too quickly can cause serious side effects. It can also make your condition worse. A special MedGuide will be given to you by the pharmacist with each prescription and refill. Be sure to read this information carefully each time. Talk to your care team about the use of this medication in children. Special care may be needed. Overdosage: If you think you have taken too much of this medicine contact a poison control center or emergency room at once. NOTE: This medicine is only for you. Do not share this medicine with others. What if I miss a dose? If you miss a dose, take it as soon as you can. If it is almost time for your next dose, take only that dose. Do not take double or extra doses. What may interact with this medication? Do not take this medication with any of the following: Alcohol Certain medications for fungal infections, such as fluconazole, itraconazole, ketoconazole, posaconazole, voriconazole Cisapride Desvenlafaxine Dronedarone Duloxetine Levomilnacipran Linezolid MAOIs, such as Carbex, Eldepryl, Marplan, Nardil, and Parnate Methylene blue (injected into a vein) Milnacipran Pimozide Thioridazine This medication may also interact with the following: Amphetamines Aspirin and aspirin-like medications Certain medications for mental health conditions Certain medications for migraine headaches, such as almotriptan, eletriptan, frovatriptan, naratriptan, rizatriptan, sumatriptan, zolmitriptan Certain medications for sleep Certain medications that treat or prevent blood clots, such as dalteparin, enoxaparin, warfarin Cimetidine Clozapine Diuretics Fentanyl Furazolidone Indinavir Isoniazid Lithium Metoprolol NSAIDS,  medications for pain and inflammation,  such as ibuprofen or naproxen Other medications that cause heart rhythm changes Procarbazine Rasagiline Supplements, such as St. John's wort, kava kava, valerian Tramadol Tryptophan This list may not describe all possible interactions. Give your health care provider a list of all the medicines, herbs, non-prescription drugs, or dietary supplements you use. Also tell them if you smoke, drink alcohol, or use illegal drugs. Some items may interact with your medicine. What should I watch for while using this medication? Tell your care team if your symptoms do not get better or if they get worse. Visit your care team for regular checks on your progress. Because it may take several weeks to see the full effects of this medication, it is important to continue your treatment as prescribed by your care team. Watch for new or worsening thoughts of suicide or depression. This includes sudden changes in mood, behaviors, or thoughts. These changes can happen at any time but are more common in the beginning of treatment or after a change in dose. Call your care team right away if you experience these thoughts or worsening depression. This medication may cause mood and behavior changes, such as anxiety, nervousness, irritability, hostility, restlessness, excitability, hyperactivity, or trouble sleeping. These changes can happen at any time but are more common in the beginning of treatment or after a change in dose. Call your care team right away if you notice any of these symptoms. This medication can cause an increase in blood pressure. Check with your care team for instructions on monitoring your blood pressure while taking this medication. This medication may affect your coordination, reaction time, or judgment. Do not drive or operate machinery until you know how this medication affects you. Sit up or stand slowly to reduce the risk of dizzy or fainting spells. Drinking alcohol with this medication can increase  the risk of these side effects. Your mouth may get dry. Chewing sugarless gum or sucking hard candy and drinking plenty of water may help. Contact your care team if the problem does not go away or is severe. What side effects may I notice from receiving this medication? Side effects that you should report to your care team as soon as possible: Allergic reactions--skin rash, itching, hives, swelling of the face, lips, tongue, or throat Bleeding--bloody or black, tar-like stools, red or dark brown urine, vomiting blood or brown material that looks like coffee grounds, small, red or purple spots on skin, unusual bleeding or bruising Heart rhythm changes--fast or irregular heartbeat, dizziness, feeling faint or lightheaded, chest pain, trouble breathing Increase in blood pressure Loss of appetite with weight loss Low sodium level--muscle weakness, fatigue, dizziness, headache, confusion Serotonin syndrome--irritability, confusion, fast or irregular heartbeat, muscle stiffness, twitching muscles, sweating, high fever, seizures, chills, vomiting, diarrhea Sudden eye pain or change in vision such as blurry vision, seeing halos around lights, vision loss Thoughts of suicide or self-harm, worsening mood, feelings of depression Side effects that usually do not require medical attention (report to your care team if they continue or are bothersome): Anxiety, nervousness Change in sex drive or performance Dizziness Dry mouth Excessive sweating Nausea Tremors or shaking Trouble sleeping This list may not describe all possible side effects. Call your doctor for medical advice about side effects. You may report side effects to FDA at 1-800-FDA-1088. Where should I keep my medication? Keep out of the reach of children and pets. Store at a controlled temperature between 20 and 25 degrees C (68 degrees and  77 degrees F), in a dry place. Throw away any unused medication after the expiration date. NOTE: This  sheet is a summary. It may not cover all possible information. If you have questions about this medicine, talk to your doctor, pharmacist, or health care provider.  2024 Elsevier/Gold Standard (2022-04-09 00:00:00)

## 2023-02-09 ENCOUNTER — Encounter: Payer: Self-pay | Admitting: Physician Assistant

## 2023-02-17 ENCOUNTER — Other Ambulatory Visit (HOSPITAL_COMMUNITY): Payer: Self-pay

## 2023-02-17 ENCOUNTER — Ambulatory Visit: Payer: Self-pay | Admitting: Physician Assistant

## 2023-02-17 ENCOUNTER — Encounter: Payer: Self-pay | Admitting: Physician Assistant

## 2023-02-17 ENCOUNTER — Other Ambulatory Visit: Payer: Self-pay

## 2023-02-17 ENCOUNTER — Telehealth: Payer: Self-pay | Admitting: Internal Medicine

## 2023-02-17 VITALS — BP 104/69 | HR 70 | Ht 59.0 in | Wt 121.0 lb

## 2023-02-17 DIAGNOSIS — K219 Gastro-esophageal reflux disease without esophagitis: Secondary | ICD-10-CM

## 2023-02-17 DIAGNOSIS — F411 Generalized anxiety disorder: Secondary | ICD-10-CM

## 2023-02-17 MED ORDER — BUPROPION HCL ER (XL) 150 MG PO TB24
150.0000 mg | ORAL_TABLET | Freq: Every day | ORAL | 1 refills | Status: DC
  Filled 2023-02-17: qty 30, 30d supply, fill #0
  Filled 2023-03-20 – 2023-03-21 (×2): qty 30, 30d supply, fill #1

## 2023-02-17 MED ORDER — OMEPRAZOLE 20 MG PO CPDR
40.0000 mg | DELAYED_RELEASE_CAPSULE | Freq: Every day | ORAL | 1 refills | Status: DC
  Filled 2023-02-17: qty 30, 15d supply, fill #0

## 2023-02-17 NOTE — Telephone Encounter (Signed)
Contacted pt to inform her of [facility] locations to come in for follow up. Unable to contact/LVM

## 2023-02-17 NOTE — Patient Instructions (Signed)
You are going to stop the Effexor and start Wellbutrin once daily.  I do encourage you to increase the omeprazole to 40 mg once daily for the next week and then reduce back to 20 mg once daily.  You will return to the mobile unit in 2 weeks, we will call you to remind you where we are or set up a virtual visit, what ever is more convenient for you  Roney Jaffe, PA-C Physician Assistant [facility] Medicine [email or web address]   Abdominal Bloating When you have abdominal bloating, your abdomen may feel full, tight, or painful. It may also look bigger than normal or swollen (distended). Common causes of abdominal bloating include: Swallowing air. Constipation. Problems digesting food. Eating too much. Irritable bowel syndrome. This is a condition that affects the large intestine. Lactose intolerance. This is an inability to digest lactose, a natural sugar in dairy products. Celiac disease. This is a condition that affects the ability to digest gluten, a protein found in some grains. Gastroparesis. This is a condition that slows down the movement of food in the stomach and small intestine. It is more common in people with diabetes mellitus. Gastroesophageal reflux disease (GERD). This is a condition that makes stomach acid flow back into the esophagus. Urinary retention. This means that the body is holding onto urine, and the bladder cannot be emptied all the way. Follow these instructions at home: Eating and drinking Avoid eating too much. Try not to swallow air while talking or eating. Avoid eating while lying down. Avoid these foods and drinks: Foods that cause gas, such as broccoli, cabbage, cauliflower, and baked beans. Carbonated drinks. Hard candy. Chewing gum. Medicines Take over-the-counter and prescription medicines only as told by your health care provider. Take probiotic medicines. These medicines contain live bacteria or  yeasts that can help digestion. Take coated peppermint oil capsules. General instructions Try to exercise regularly. Exercise may help to relieve bloating that is caused by gas and relieve constipation. Keep all follow-up visits. This is important. Contact a health care provider if: You have nausea and vomiting. You have diarrhea. You have abdominal pain. You have unusual weight loss or weight gain. You have severe pain, and medicines do not help. Get help right away if: You have chest pain. You have trouble breathing. You have shortness of breath. You have trouble urinating. You have darker urine than normal. You have blood in your stools or have dark, tarry stools. These symptoms may represent a serious problem that is an emergency. Do not wait to see if the symptoms will go away. Get medical help right away. Call your local emergency services (911 in the [place] Do not drive yourself to the hospital. Summary Abdominal bloating means that the abdomen is swollen. Common causes of abdominal bloating are swallowing air, constipation, and problems digesting food. Avoid eating too much and avoid swallowing air. Avoid foods that cause gas, carbonated drinks, hard candy, and chewing gum. This information is not intended to replace advice given to you by your health care provider. Make sure you discuss any questions you have with your health care provider. Document Revised: 11/14/2019 Document Reviewed: 11/14/2019 Elsevier Patient Education  2024 [employer].

## 2023-02-17 NOTE — Progress Notes (Unsigned)
Established Patient Office Visit  Subjective   Patient ID: Alicia Cochran, female    DOB: 01/30/1991  Age: 32 y.o. MRN: [id number]  Chief Complaint  Patient presents with   Follow-up    Patient states she is still having bloating, and abdominal pain after eating     States that she would like to discontinue the effexor at this time.  States that she does feel it is offering some relief from her anxiety, however she has started having abdominal discomfort and bloating again like she did with the previous medications.  States that she is taking the trazodone at bedtime, states that she still had difficulty sleeping last night.        02/17/2023    2:24 PM 01/13/2023    4:29 PM 01/07/2023    2:51 PM 11/17/2022   11:11 AM 06/30/2022    9:33 AM  Depression screen PHQ 2/9  Decreased Interest 1 1 1 1 1   Down, Depressed, Hopeless 0 1 0 0 1  PHQ - 2 Score 1 2 1 1 2   Altered sleeping 2 2 2 1 1   Tired, decreased energy 1 2 2 1 1   Change in appetite 2 2 2 1  0  Feeling bad or failure about yourself  0 1 0 0 0  Trouble concentrating 0 1 0 0 0  Moving slowly or fidgety/restless 0 1 0 0 0  Suicidal thoughts 0 0 0 0 0  PHQ-9 Score 6 11 7 4 4   Difficult doing work/chores    Not difficult at all       02/17/2023    2:24 PM 01/13/2023    4:29 PM 01/07/2023    2:51 PM 11/17/2022   11:11 AM  GAD 7 : Generalized Anxiety Score  Nervous, Anxious, on Edge 2 2 1  0  Control/stop worrying 2 1 1  0  Worry too much - different things 2 1 1  0  Trouble relaxing 2 1 1  0  Restless 1 1 1  0  Easily annoyed or irritable 2 1 0 0  Afraid - awful might happen 2 1 2  0  Total GAD 7 Score 13 8 7  0  Anxiety Difficulty  Somewhat difficult  Not difficult at all      Past Medical History:  Diagnosis Date   Anal fissure    Anxiety    Bowel obstruction (HCC)    Cholestasis of pregnancy in third trimester    Chronic headaches    Hx of chlamydia infection    IBS (irritable bowel syndrome)     Ulcerative colitis (HCC)    Social History   Socioeconomic History   Marital status: Single    Spouse name: Not on file   Number of children: 1   Years of education: Not on file   Highest education level: Not on file  Occupational History   Occupation: server  Tobacco Use   Smoking status: Never    Passive exposure: Never   Smokeless tobacco: Never  Vaping Use   Vaping status: Never Used  Substance and Sexual Activity   Alcohol use: No    Alcohol/week: 0.0 standard drinks of alcohol   Drug use: No   Sexual activity: Yes  Other Topics Concern   Not on file  Social History Narrative   ** Merged History Encounter **       Social Determinants of Health   Financial Resource Strain: Not on file  Food Insecurity: Not on file  Transportation Needs: Not on  file  Physical Activity: Not on file  Stress: Not on file  Social Connections: Not on file  Intimate Partner Violence: Not on file   Family History  Problem Relation Age of Onset   Asthma Mother    Diabetes Maternal Grandmother    Alcohol abuse Neg Hx    Arthritis Neg Hx    Birth defects Neg Hx    Cancer Neg Hx    COPD Neg Hx    Depression Neg Hx    Drug abuse Neg Hx    Early death Neg Hx    Hearing loss Neg Hx    Heart disease Neg Hx    Hyperlipidemia Neg Hx    Hypertension Neg Hx    Kidney disease Neg Hx    Learning disabilities Neg Hx    Mental illness Neg Hx    Mental retardation Neg Hx    Miscarriages / Stillbirths Neg Hx    Stroke Neg Hx    Vision loss Neg Hx    Varicose Veins Neg Hx    No Known Allergies  Review of Systems  Constitutional: Negative.   HENT: Negative.    Eyes: Negative.   Respiratory:  Negative for shortness of breath.   Cardiovascular:  Negative for chest pain.  Gastrointestinal:  Negative for nausea and vomiting.  Genitourinary: Negative.   Musculoskeletal: Negative.   Skin: Negative.   Neurological: Negative.   Endo/Heme/Allergies: Negative.   Psychiatric/Behavioral:   Negative for depression. The patient is nervous/anxious.       Objective:     BP 104/69 (BP Location: Left Arm, Patient Position: Sitting, Cuff Size: Normal)   Pulse 70   Ht 4\' 11"  (1.499 m)   Wt 121 lb (54.9 kg)   LMP 01/27/2023   SpO2 96%   BMI 24.44 kg/m  BP Readings from Last 3 Encounters:  02/17/23 104/69  01/13/23 104/71  01/07/23 98/61   Wt Readings from Last 3 Encounters:  02/17/23 121 lb (54.9 kg)  01/13/23 121 lb (54.9 kg)  01/07/23 121 lb (54.9 kg)    Physical Exam Vitals and nursing note reviewed.  Constitutional:      Appearance: Normal appearance.  HENT:     Head: Normocephalic and atraumatic.     Right Ear: External ear normal.     Left Ear: External ear normal.     Nose: Nose normal.     Mouth/Throat:     Mouth: Mucous membranes are moist.     Pharynx: Oropharynx is clear.  Eyes:     Extraocular Movements: Extraocular movements intact.     Pupils: Pupils are equal, round, and reactive to light.  Cardiovascular:     Rate and Rhythm: Normal rate and regular rhythm.     Pulses: Normal pulses.     Heart sounds: Normal heart sounds.  Pulmonary:     Effort: Pulmonary effort is normal.     Breath sounds: Normal breath sounds.  Musculoskeletal:        General: Normal range of motion.     Cervical back: Normal range of motion and neck supple.  Skin:    General: Skin is warm and dry.  Neurological:     General: No focal deficit present.     Mental Status: She is alert and oriented to person, place, and time.  Psychiatric:        Mood and Affect: Mood normal.        Behavior: Behavior normal.  Thought Content: Thought content normal.        Judgment: Judgment normal.        Assessment & Plan:   Problem List Items Addressed This Visit       Digestive   Gastroesophageal reflux disease without esophagitis   Relevant Medications   omeprazole (PRILOSEC) 20 MG capsule     Other   GAD (generalized anxiety disorder) - Primary   Relevant  Medications   buPROPion (WELLBUTRIN XL) 150 MG 24 hr tablet  1. GAD (generalized anxiety disorder) Discontinue Effexor or, trial Wellbutrin.  Patient to follow-up with mobile unit in 2 weeks.  Red flags given for prompt reevaluation - buPROPion (WELLBUTRIN XL) 150 MG 24 hr tablet; Take 1 tablet (150 mg total) by mouth daily.  Dispense: 30 tablet; Refill: 1  2. Gastroesophageal reflux disease without esophagitis Encouraged patient to increase Prilosec to 40 mg for 1 week then do trial of reducing to 20 mg for 1 week patient education given on lifestyle modifications - omeprazole (PRILOSEC) 20 MG capsule; Take 2 capsules (40 mg total) by mouth daily.  Dispense: 30 capsule; Refill: 1   I have reviewed the patient's medical history (PMH, PSH, Social History, Family History, Medications, and allergies) , and have been updated if relevant. I spent 30 minutes reviewing chart and  face to face time with patient.   Return in about 2 weeks (around 03/03/2023) for With [facility].    Kasandra Knudsen Mayers, PA-C

## 2023-02-18 ENCOUNTER — Encounter: Payer: Self-pay | Admitting: Physician Assistant

## 2023-02-24 ENCOUNTER — Ambulatory Visit: Payer: Self-pay | Admitting: Internal Medicine

## 2023-03-01 ENCOUNTER — Telehealth: Payer: Self-pay | Admitting: Internal Medicine

## 2023-03-01 ENCOUNTER — Other Ambulatory Visit (HOSPITAL_COMMUNITY): Payer: Self-pay

## 2023-03-01 NOTE — Telephone Encounter (Signed)
Contacted pt to inform her of the location for [facility] , to complete follow up. Pt stated she will come by to be seen this week.

## 2023-03-02 ENCOUNTER — Other Ambulatory Visit: Payer: Self-pay

## 2023-03-02 ENCOUNTER — Ambulatory Visit: Payer: Self-pay | Admitting: Physician Assistant

## 2023-03-02 VITALS — BP 98/57 | HR 65 | Wt 120.0 lb

## 2023-03-02 DIAGNOSIS — F411 Generalized anxiety disorder: Secondary | ICD-10-CM

## 2023-03-02 DIAGNOSIS — J302 Other seasonal allergic rhinitis: Secondary | ICD-10-CM

## 2023-03-02 DIAGNOSIS — K219 Gastro-esophageal reflux disease without esophagitis: Secondary | ICD-10-CM

## 2023-03-02 DIAGNOSIS — L739 Follicular disorder, unspecified: Secondary | ICD-10-CM

## 2023-03-02 MED ORDER — OMEPRAZOLE 20 MG PO CPDR
40.0000 mg | DELAYED_RELEASE_CAPSULE | Freq: Every day | ORAL | 1 refills | Status: DC
  Filled 2023-03-02: qty 180, 90d supply, fill #0
  Filled 2023-07-09: qty 180, 90d supply, fill #1

## 2023-03-02 MED ORDER — TRAZODONE HCL 100 MG PO TABS
100.0000 mg | ORAL_TABLET | Freq: Every evening | ORAL | 1 refills | Status: DC | PRN
  Filled 2023-03-02: qty 30, 30d supply, fill #0
  Filled 2023-04-12 (×2): qty 30, 30d supply, fill #1

## 2023-03-02 MED ORDER — LORATADINE 10 MG PO TABS
10.0000 mg | ORAL_TABLET | Freq: Every day | ORAL | 3 refills | Status: AC
  Filled 2023-03-02: qty 30, 30d supply, fill #0
  Filled 2023-06-02 – 2023-06-28 (×3): qty 30, 30d supply, fill #1
  Filled 2023-10-21 – 2023-10-22 (×2): qty 30, 30d supply, fill #2
  Filled 2023-11-16 – 2023-11-26 (×3): qty 30, 30d supply, fill #3

## 2023-03-02 NOTE — Progress Notes (Unsigned)
   Established Patient Office Visit  Subjective   Patient ID: Quyen Cutsforth, female    DOB: 04/18/1991  Age: 32 y.o. MRN: [id number]  Chief Complaint  Patient presents with   Follow-up   soreness    Patient feeling soreness at the top of head near her pony tail.     1. GAD (generalized anxiety disorder) Discontinue Effexor or, trial Wellbutrin.  Patient to follow-up with mobile unit in 2 weeks.  Red flags given for prompt reevaluation - buPROPion (WELLBUTRIN XL) 150 MG 24 hr tablet; Take 1 tablet (150 mg total) by mouth daily.  Dispense: 30 tablet; Refill: 1   2. Gastroesophageal reflux disease without esophagitis Encouraged patient to increase Prilosec to 40 mg for 1 week then do trial of reducing to 20 mg for 1 week patient education given on lifestyle modifications - omeprazole (PRILOSEC) 20 MG capsule; Take 2 capsules (40 mg total) by mouth daily.  Dispense: 30 capsule; Refill: 1   Still losing temper quickly - but anxiety is improved  Sleep is good   Hurts all the feels swollen -  longer than Has been doing low ponytails without relief No conditioning treatment  Brushing hurts at the top     {History (Optional):23778}  ROS    Objective:     BP (!) 98/57 (BP Location: Left Arm, Patient Position: Sitting, Cuff Size: Normal)   Pulse 65   Wt 120 lb (54.4 kg)   LMP 01/27/2023   SpO2 98%   BMI 24.24 kg/m  {Vitals History (Optional):23777}  Physical Exam   No results found for any visits on 03/02/23.  {Labs (Optional):23779}  The ASCVD Risk score (Arnett DK, et al., 2019) failed to calculate for the following reasons:   The 2019 ASCVD risk score is only valid for ages 67 to 49    Assessment & Plan:   Problem List Items Addressed This Visit   None   No follow-ups on file.    Kasandra Knudsen Mayers, PA-C

## 2023-03-02 NOTE — Patient Instructions (Addendum)
I sent refills of your medications to your pharmacy.  I encourage you to do a trial of Selsun Blue to help with your scalp pain.  You will purchase this over-the-counter.  You will return to the mobile unit in approximately 6 weeks for follow-up.  We will call you to let you know of our location.  Roney Jaffe, PA-C Physician Assistant [facility] [email or web address]  Health Maintenance, Female Adopting a healthy lifestyle and getting preventive care are important in promoting health and wellness. Ask your health care provider about: The right schedule for you to have regular tests and exams. Things you can do on your own to prevent diseases and keep yourself healthy. What should I know about diet, weight, and exercise? Eat a healthy diet  Eat a diet that includes plenty of vegetables, fruits, low-fat dairy products, and lean protein. Do not eat a lot of foods that are high in solid fats, added sugars, or sodium. Maintain a healthy weight Body mass index (BMI) is used to identify weight problems. It estimates body fat based on height and weight. Your health care provider can help determine your BMI and help you achieve or maintain a healthy weight. Get regular exercise Get regular exercise. This is one of the most important things you can do for your health. Most adults should: Exercise for at least 150 minutes each week. The exercise should increase your heart rate and make you sweat (moderate-intensity exercise). Do strengthening exercises at least twice a week. This is in addition to the moderate-intensity exercise. Spend less time sitting. Even light physical activity can be beneficial. Watch cholesterol and blood lipids Have your blood tested for lipids and cholesterol at 32 years of age, then have this test every 5 years. Have your cholesterol levels checked more often if: Your lipid or cholesterol levels are high. You are  older than 32 years of age. You are at high risk for heart disease. What should I know about cancer screening? Depending on your health history and family history, you may need to have cancer screening at various ages. This may include screening for: Breast cancer. Cervical cancer. Colorectal cancer. Skin cancer. Lung cancer. What should I know about heart disease, diabetes, and high blood pressure? Blood pressure and heart disease High blood pressure causes heart disease and increases the risk of stroke. This is more likely to develop in people who have high blood pressure readings or are overweight. Have your blood pressure checked: Every 3-5 years if you are 84-20 years of age. Every year if you are 62 years old or older. Diabetes Have regular diabetes screenings. This checks your fasting blood sugar level. Have the screening done: Once every three years after age 42 if you are at a normal weight and have a low risk for diabetes. More often and at a younger age if you are overweight or have a high risk for diabetes. What should I know about preventing infection? Hepatitis B If you have a higher risk for hepatitis B, you should be screened for this virus. Talk with your health care provider to find out if you are at risk for hepatitis B infection. Hepatitis C Testing is recommended for: Everyone born from [birth year] through 1965. Anyone with known risk factors for hepatitis C. Sexually transmitted infections (STIs) Get screened for STIs, including gonorrhea and chlamydia, if: You are sexually active and are younger than 32 years of age. You are older than 32 years of age and your health  care provider tells you that you are at risk for this type of infection. Your sexual activity has changed since you were last screened, and you are at increased risk for chlamydia or gonorrhea. Ask your health care provider if you are at risk. Ask your health care provider about whether you are at high risk  for HIV. Your health care provider may recommend a prescription medicine to help prevent HIV infection. If you choose to take medicine to prevent HIV, you should first get tested for HIV. You should then be tested every 3 months for as long as you are taking the medicine. Pregnancy If you are about to stop having your period (premenopausal) and you may become pregnant, seek counseling before you get pregnant. Take 400 to 800 micrograms (mcg) of folic acid every day if you become pregnant. Ask for birth control (contraception) if you want to prevent pregnancy. Osteoporosis and menopause Osteoporosis is a disease in which the bones lose minerals and strength with aging. This can result in bone fractures. If you are 22 years old or older, or if you are at risk for osteoporosis and fractures, ask your health care provider if you should: Be screened for bone loss. Take a calcium or vitamin D supplement to lower your risk of fractures. Be given hormone replacement therapy (HRT) to treat symptoms of menopause. Follow these instructions at home: Alcohol use Do not drink alcohol if: Your health care provider tells you not to drink. You are pregnant, may be pregnant, or are planning to become pregnant. If you drink alcohol: Limit how much you have to: 0-1 drink a day. Know how much alcohol is in your drink. In the [place] one drink equals one 12 oz bottle of beer (355 mL), one 5 oz glass of wine (148 mL), or one 1 oz glass of hard liquor (44 mL). Lifestyle Do not use any products that contain nicotine or tobacco. These products include cigarettes, chewing tobacco, and vaping devices, such as e-cigarettes. If you need help quitting, ask your health care provider. Do not use street drugs. Do not share needles. Ask your health care provider for help if you need support or information about quitting drugs. General instructions Schedule regular health, dental, and eye exams. Stay current with your  vaccines. Tell your health care provider if: You often feel depressed. You have ever been abused or do not feel safe at home. Summary Adopting a healthy lifestyle and getting preventive care are important in promoting health and wellness. Follow your health care provider's instructions about healthy diet, exercising, and getting tested or screened for diseases. Follow your health care provider's instructions on monitoring your cholesterol and blood pressure. This information is not intended to replace advice given to you by your health care provider. Make sure you discuss any questions you have with your health care provider. Document Revised: 09/02/2020 Document Reviewed: 09/02/2020 Elsevier Patient Education  2024 [employer].

## 2023-03-03 ENCOUNTER — Encounter: Payer: Self-pay | Admitting: Physician Assistant

## 2023-03-05 ENCOUNTER — Other Ambulatory Visit: Payer: Self-pay

## 2023-03-21 ENCOUNTER — Other Ambulatory Visit: Payer: Self-pay

## 2023-03-22 ENCOUNTER — Other Ambulatory Visit: Payer: Self-pay

## 2023-04-10 ENCOUNTER — Telehealth: Payer: Self-pay | Admitting: Physician Assistant

## 2023-04-10 DIAGNOSIS — R3989 Other symptoms and signs involving the genitourinary system: Secondary | ICD-10-CM

## 2023-04-10 MED ORDER — CEPHALEXIN 500 MG PO CAPS: 500.0000 mg | ORAL_CAPSULE | Freq: Two times a day (BID) | ORAL | 0 refills | Status: AC

## 2023-04-10 NOTE — Progress Notes (Signed)

## 2023-04-10 NOTE — Progress Notes (Signed)
I have spent 5 minutes in review of e-visit questionnaire, review and updating patient chart, medical decision making and response to patient.   Laure Kidney, PA-C

## 2023-04-12 ENCOUNTER — Telehealth: Payer: Self-pay | Admitting: Internal Medicine

## 2023-04-12 ENCOUNTER — Other Ambulatory Visit: Payer: Self-pay

## 2023-04-12 NOTE — Telephone Encounter (Signed)
Reached out pt to inform her of [facility] locations to complete follow up.No answer/LVM.

## 2023-04-13 ENCOUNTER — Encounter: Payer: Self-pay | Admitting: Physician Assistant

## 2023-04-13 ENCOUNTER — Other Ambulatory Visit: Payer: Self-pay

## 2023-04-13 ENCOUNTER — Ambulatory Visit: Payer: Self-pay | Admitting: Physician Assistant

## 2023-04-13 VITALS — BP 100/68 | HR 62 | Ht 59.0 in | Wt 118.0 lb

## 2023-04-13 DIAGNOSIS — T3695XA Adverse effect of unspecified systemic antibiotic, initial encounter: Secondary | ICD-10-CM

## 2023-04-13 DIAGNOSIS — B379 Candidiasis, unspecified: Secondary | ICD-10-CM

## 2023-04-13 DIAGNOSIS — F411 Generalized anxiety disorder: Secondary | ICD-10-CM

## 2023-04-13 MED ORDER — FLUCONAZOLE 150 MG PO TABS
150.0000 mg | ORAL_TABLET | Freq: Once | ORAL | 1 refills | Status: AC
  Filled 2023-04-13: qty 1, 1d supply, fill #0

## 2023-04-13 MED ORDER — TRAZODONE HCL 100 MG PO TABS
100.0000 mg | ORAL_TABLET | Freq: Every evening | ORAL | 1 refills | Status: DC | PRN
  Filled 2023-05-13: qty 30, 30d supply, fill #0
  Filled 2023-06-28 (×2): qty 30, 30d supply, fill #1

## 2023-04-13 MED ORDER — BUPROPION HCL ER (XL) 150 MG PO TB24
150.0000 mg | ORAL_TABLET | Freq: Every day | ORAL | 1 refills | Status: DC
  Filled 2023-04-13: qty 30, 30d supply, fill #0
  Filled 2023-05-18 (×2): qty 30, 30d supply, fill #1

## 2023-04-13 NOTE — Progress Notes (Signed)
Established Patient Office Visit  Subjective   Patient ID: Alicia Cochran, female    DOB: 17-Dec-1990  Age: 32 y.o. MRN: [id number]  Chief Complaint  Patient presents with   Anxiety    States that she does feel her anxiety has improved with the Wellbutrin.  Does endorse that she is feeling more emotions, states that she has had random acts of crying in situations where she normally would not have cried previously.  States that it does still take her a little bit of time to go to sleep but in general sleep is good with the exception of 1 night last week due to having a possible UTI.  States that she was treated for UTI with Keflex.  States that she did not e-visit so it was not a confirmed UTI.  States that she has been taking the antibiotic without relief, but does endorse that she started having vaginal irritation and a whitish discharge.  Does states she has a history of getting yeast infections from antibiotics.  States that she has tried using the [employer] on a daily basis for relief of scalp irritation.  States that it will help for 1 to 2 days but does still have some irritation on the top of her head.    Past Medical History:  Diagnosis Date   Anal fissure    Anxiety    Bowel obstruction (HCC)    Cholestasis of pregnancy in third trimester    Chronic headaches    Hx of chlamydia infection    IBS (irritable bowel syndrome)    Ulcerative colitis (HCC)    Social History   Socioeconomic History   Marital status: Single    Spouse name: Not on file   Number of children: 1   Years of education: Not on file   Highest education level: Not on file  Occupational History   Occupation: server  Tobacco Use   Smoking status: Never    Passive exposure: Never   Smokeless tobacco: Never  Vaping Use   Vaping status: Never Used  Substance and Sexual Activity   Alcohol use: No    Alcohol/week: 0.0 standard drinks of alcohol   Drug use: No   Sexual activity: Yes   Other Topics Concern   Not on file  Social History Narrative   ** Merged History Encounter **       Social Drivers of Corporate investment banker Strain: Not on file  Food Insecurity: Not on file  Transportation Needs: Not on file  Physical Activity: Not on file  Stress: Not on file  Social Connections: Not on file  Intimate Partner Violence: Not on file   Family History  Problem Relation Age of Onset   Asthma Mother    Diabetes Maternal Grandmother    Alcohol abuse Neg Hx    Arthritis Neg Hx    Birth defects Neg Hx    Cancer Neg Hx    COPD Neg Hx    Depression Neg Hx    Drug abuse Neg Hx    Early death Neg Hx    Hearing loss Neg Hx    Heart disease Neg Hx    Hyperlipidemia Neg Hx    Hypertension Neg Hx    Kidney disease Neg Hx    Learning disabilities Neg Hx    Mental illness Neg Hx    Mental retardation Neg Hx    Miscarriages / Stillbirths Neg Hx    Stroke Neg Hx  Vision loss Neg Hx    Varicose Veins Neg Hx    No Known Allergies  Review of Systems  Constitutional:  Negative for chills and fever.  HENT: Negative.    Eyes: Negative.   Respiratory:  Negative for shortness of breath.   Cardiovascular:  Negative for chest pain.  Gastrointestinal: Negative.   Genitourinary: Negative.   Musculoskeletal: Negative.   Skin: Negative.   Neurological: Negative.   Endo/Heme/Allergies: Negative.   Psychiatric/Behavioral: Negative.        Objective:     BP 100/68 (BP Location: Left Arm, Patient Position: Sitting, Cuff Size: Normal)   Pulse 62   Ht 4\' 11"  (1.499 m)   Wt 118 lb (53.5 kg)   LMP 03/30/2023   SpO2 97%   BMI 23.83 kg/m  BP Readings from Last 3 Encounters:  04/13/23 100/68  03/02/23 (!) 98/57  02/17/23 104/69   Wt Readings from Last 3 Encounters:  04/13/23 118 lb (53.5 kg)  03/02/23 120 lb (54.4 kg)  02/17/23 121 lb (54.9 kg)    Physical Exam    Assessment & Plan:   Problem List Items Addressed This Visit       Other   GAD  (generalized anxiety disorder)   Relevant Medications   traZODone (DESYREL) 100 MG tablet   buPROPion (WELLBUTRIN XL) 150 MG 24 hr tablet   Other Visit Diagnoses       Antibiotic-induced yeast infection    -  Primary   Relevant Medications   fluconazole (DIFLUCAN) 150 MG tablet     1. GAD (generalized anxiety disorder) Continue current regimen.  Patient education given on coping skills.  Patient continues to decline cognitive behavior therapy.  Red flags given for prompt reevaluation  Patient given application for [place] financial assistance - traZODone (DESYREL) 100 MG tablet; Take 1 tablet (100 mg total) by mouth at bedtime as needed.  Dispense: 30 tablet; Refill: 1 - buPROPion (WELLBUTRIN XL) 150 MG 24 hr tablet; Take 1 tablet (150 mg total) by mouth daily.  Dispense: 30 tablet; Refill: 1  2. Antibiotic-induced yeast infection (Primary) Trial Diflucan. - fluconazole (DIFLUCAN) 150 MG tablet; Take 1 tablet (150 mg total) by mouth once for 1 dose.  Dispense: 1 tablet; Refill: 1   I have reviewed the patient's medical history (PMH, PSH, Social History, Family History, Medications, and allergies) , and have been updated if relevant. I spent 30 minutes reviewing chart and  face to face time with patient.    No follow-ups on file.    Kasandra Knudsen Mayers, PA-C

## 2023-04-13 NOTE — Patient Instructions (Signed)
You are going to take Diflucan 1 time to help with your yeast infection.  If the yeast infection continues after you finish your antibiotics, you may take Diflucan once again.  I encourage you to work on active journaling. Look for prompts online to help you get started   Please let us know if there is anything else we can do for you  Roney Jaffe, PA-C Physician Assistant [facility] [email or web address]  Vaginal Yeast Infection, Adult  Vaginal yeast infection is a condition that causes vaginal discharge as well as soreness, swelling, and redness (inflammation) of the vagina. This is a common condition. Some women get this infection frequently. What are the causes? This condition is caused by a change in the normal balance of the yeast (Candida) and normal bacteria that live in the vagina. This change causes an overgrowth of yeast, which causes the inflammation. What increases the risk? The condition is more likely to develop in women who: Take antibiotic medicines. Have diabetes. Take birth control pills. Are pregnant. Douche often. Have a weak body defense system (immune system). Have been taking steroid medicines for a long time. Frequently wear tight clothing. What are the signs or symptoms? Symptoms of this condition include: White, thick, creamy vaginal discharge. Swelling, itching, redness, and irritation of the vagina. The lips of the vagina (labia) may be affected as well. Pain or a burning feeling while urinating. Pain during sex. How is this diagnosed? This condition is diagnosed based on: Your medical history. A physical exam. A pelvic exam. Your health care provider will examine a sample of your vaginal discharge under a microscope. Your health care provider may send this sample for testing to confirm the diagnosis. How is this treated? This condition is treated with medicine. Medicines may be over-the-counter  or prescription. You may be told to use one or more of the following: Medicine that is taken by mouth (orally). Medicine that is applied as a cream (topically). Medicine that is inserted directly into the vagina (suppository). Follow these instructions at home: Take or apply over-the-counter and prescription medicines only as told by your health care provider. Do not use tampons until your health care provider approves. Do not have sex until your infection has cleared. Sex can prolong or worsen your symptoms of infection. Ask your health care provider when it is safe to resume sexual activity. Keep all follow-up visits. This is important. How is this prevented?  Do not wear tight clothes, such as pantyhose or tight pants. Wear breathable cotton underwear. Do not use douches, perfumed soap, creams, or powders. Wipe from front to back after using the toilet. If you have diabetes, keep your blood sugar levels under control. Ask your health care provider for other ways to prevent yeast infections. Contact a health care provider if: You have a fever. Your symptoms go away and then return. Your symptoms do not get better with treatment. Your symptoms get worse. You have new symptoms. You develop blisters in or around your vagina. You have blood coming from your vagina and it is not your menstrual period. You develop pain in your abdomen. Summary Vaginal yeast infection is a condition that causes discharge as well as soreness, swelling, and redness (inflammation) of the vagina. This condition is treated with medicine. Medicines may be over-the-counter or prescription. Take or apply over-the-counter and prescription medicines only as told by your health care provider. Do not douche. Resume sexual activity or use of tampons as instructed by your health  care provider. Contact a health care provider if your symptoms do not get better with treatment or your symptoms go away and then return. This  information is not intended to replace advice given to you by your health care provider. Make sure you discuss any questions you have with your health care provider. Document Revised: 07/01/2020 Document Reviewed: 07/01/2020 Elsevier Patient Education  2024 [employer].

## 2023-05-14 ENCOUNTER — Other Ambulatory Visit: Payer: Self-pay

## 2023-05-18 ENCOUNTER — Other Ambulatory Visit: Payer: Self-pay

## 2023-05-30 ENCOUNTER — Encounter: Payer: Self-pay | Admitting: Physician Assistant

## 2023-06-02 ENCOUNTER — Other Ambulatory Visit: Payer: Self-pay

## 2023-06-11 ENCOUNTER — Other Ambulatory Visit: Payer: Self-pay

## 2023-06-28 ENCOUNTER — Other Ambulatory Visit: Payer: Self-pay

## 2023-07-09 ENCOUNTER — Other Ambulatory Visit: Payer: Self-pay

## 2023-07-13 ENCOUNTER — Other Ambulatory Visit: Payer: Self-pay

## 2023-08-11 ENCOUNTER — Other Ambulatory Visit: Payer: Self-pay

## 2023-08-11 ENCOUNTER — Ambulatory Visit: Payer: Self-pay | Admitting: Physician Assistant

## 2023-08-11 ENCOUNTER — Encounter: Payer: Self-pay | Admitting: Physician Assistant

## 2023-08-11 VITALS — BP 105/72 | HR 67 | Ht 59.0 in | Wt 114.6 lb

## 2023-08-11 DIAGNOSIS — H579 Unspecified disorder of eye and adnexa: Secondary | ICD-10-CM

## 2023-08-11 DIAGNOSIS — R141 Gas pain: Secondary | ICD-10-CM

## 2023-08-11 DIAGNOSIS — F411 Generalized anxiety disorder: Secondary | ICD-10-CM

## 2023-08-11 DIAGNOSIS — K219 Gastro-esophageal reflux disease without esophagitis: Secondary | ICD-10-CM

## 2023-08-11 MED ORDER — OLOPATADINE HCL 0.1 % OP SOLN
1.0000 [drp] | Freq: Two times a day (BID) | OPHTHALMIC | 12 refills | Status: AC
  Filled 2023-08-11: qty 5, 25d supply, fill #0

## 2023-08-11 MED ORDER — TRAZODONE HCL 100 MG PO TABS
100.0000 mg | ORAL_TABLET | Freq: Every evening | ORAL | 1 refills | Status: AC | PRN
  Filled 2023-08-11: qty 90, 90d supply, fill #0
  Filled 2024-04-24 (×2): qty 90, 90d supply, fill #1

## 2023-08-11 MED ORDER — DICYCLOMINE HCL 10 MG PO CAPS
10.0000 mg | ORAL_CAPSULE | Freq: Three times a day (TID) | ORAL | 3 refills | Status: DC
  Filled 2023-08-11: qty 30, 8d supply, fill #0

## 2023-08-11 MED ORDER — OMEPRAZOLE 20 MG PO CPDR
40.0000 mg | DELAYED_RELEASE_CAPSULE | Freq: Every day | ORAL | 1 refills | Status: DC
  Filled 2023-08-11 – 2023-11-26 (×4): qty 180, 90d supply, fill #0

## 2023-08-11 MED ORDER — SIMETHICONE 125 MG PO CAPS
1.0000 | ORAL_CAPSULE | Freq: Three times a day (TID) | ORAL | 2 refills | Status: AC | PRN
  Filled 2023-08-11 – 2023-11-16 (×2): qty 90, 30d supply, fill #0

## 2023-08-11 NOTE — Progress Notes (Signed)
 Patient ID: Alicia Cochran, female   DOB: 1990-06-12, 33 y.o.   MRN: [id number]   Alicia Cochran, is a 33 y.o. female  QIO:[id number]  LKG:[id number]  DOB - 1990-11-04  Chief Complaint  Patient presents with   irritation eyes     Patient has been experiencing allergy symptoms. Patient has been itchy watery eyes, crust I the mornings and irritation throughout the day    Abdominal Pain    Patient has reoccurring abdominal pain, Patient had discomfort throughout the night. Feeling like she has a gas bubble that she can not pass. With abdominal pain.        Subjective:   Alicia Cochran is a 33 y.o. female here today for continued LUQ pain that seems like gas being trapped in the area and resolves if she can get the gas to come out after applying pressure to her abdomen.  She had a colonoscopy that was normal within the last year.  This has been going on for about 2 years.  H/o h. Pylori that was treated.  Some constipation but usu has loose BM.  Comes and goes.  Eats fairly healthy, fruit every date and drinks 4 large bottles water daily.  No urinary s/sx  Also itchy eyes for about the last 3 weeks.    No problems updated.  ALLERGIES: No Known Allergies  PAST MEDICAL HISTORY: Past Medical History:  Diagnosis Date   Anal fissure    Anxiety    Bowel obstruction (HCC)    Cholestasis of pregnancy in third trimester    Chronic headaches    Hx of chlamydia infection    IBS (irritable bowel syndrome)    Ulcerative colitis (HCC)     MEDICATIONS AT HOME: Prior to Admission medications   Medication Sig Start Date End Date Taking? Authorizing Provider  dicyclomine (BENTYL) 10 MG capsule Take 1 capsule (10 mg total) by mouth 4 (four) times daily -  before meals and at bedtime. Prn colicky type pain 08/11/23  Yes Anders Simmonds, PA-C  fluticasone [facility]) 50 MCG/ACT nasal spray Place 2 sprays into both nostrils daily. 04/01/22  Yes Georgian Co M, PA-C   loratadine (CLARITIN) 10 MG tablet Take 1 tablet (10 mg total) by mouth daily. 03/02/23  Yes Mayers, Cari S, PA-C  olopatadine (PATANOL) 0.1 % ophthalmic solution Place 1 drop into both eyes 2 (two) times daily. 08/11/23  Yes Anders Simmonds, PA-C  Simethicone 125 MG CAPS Take 1 capsule (125 mg total) by mouth 3 (three) times daily as needed. 08/11/23  Yes Georgian Co M, PA-C  buPROPion (WELLBUTRIN XL) 150 MG 24 hr tablet Take 1 tablet (150 mg total) by mouth daily. Patient not taking: Reported on 08/11/2023 04/13/23   Mayers, Kasandra Knudsen, PA-C  hydrOXYzine (ATARAX) 25 MG tablet Take 0.5-1 tablets (12.5-25 mg total) by mouth for feelings of severe anxiety up to 3 times daily.  (Use sparingly) Patient not taking: Reported on 02/17/2023 10/28/22   Marcine Matar, MD  ibuprofen (ADVIL) 600 MG tablet Take 1 tablet (600 mg total) by mouth every 8 (eight) hours as needed. Patient not taking: Reported on 08/11/2023 04/01/22   Anders Simmonds, PA-C  omeprazole (PRILOSEC) 20 MG capsule Take 2 capsules (40 mg total) by mouth daily. 08/11/23   Anders Simmonds, PA-C  traZODone (DESYREL) 100 MG tablet Take 1 tablet (100 mg total) by mouth at bedtime as needed. 08/11/23   Anders Simmonds, PA-C    ROS: Neg HEENT Neg resp  Neg cardiac Neg GI Neg GU Neg MS Neg psych Neg neuro  Objective:   Vitals:   08/11/23 1226  BP: 105/72  Pulse: 67  SpO2: 98%  Weight: 114 lb 9.6 oz (52 kg)  Height: 4\' 11"  (1.499 m)   Exam General appearance : Awake, alert, not in any distress. Speech Clear. Not toxic looking HEENT: Atraumatic and Normocephalic, pupils equally reactive to light and accomodation, EOM intact.  Mild chemosis R outer canthus Neck: Supple, no JVD. No cervical lymphadenopathy.  Chest: Good air entry bilaterally, CTAB.  No rales/rhonchi/wheezing CVS: S1 S2 regular, no murmurs.  Abdomen: Bowel sounds present, Non tender and not distended with no gaurding, rigidity or rebound. Extremities: B/L  Lower Ext shows no edema, both legs are warm to touch Neurology: Awake alert, and oriented X 3, CN II-XII intact, Non focal Skin: No Rash  Data Review No results found for: "HGBA1C"  Assessment & Plan   1. Gas pain (Primary) Can try(1 at a time and use whichever works better) Previous extensive work up - dicyclomine (BENTYL) 10 MG capsule; Take 1 capsule (10 mg total) by mouth 4 (four) times daily -  before meals and at bedtime. Prn colicky type pain  Dispense: 30 capsule; Refill: 3 - Comprehensive metabolic panel - Lipase - Simethicone 125 MG CAPS; Take 1 capsule (125 mg total) by mouth 3 (three) times daily as needed.  Dispense: 90 capsule; Refill: 2  2. Itchy eyes - olopatadine (PATANOL) 0.1 % ophthalmic solution; Place 1 drop into both eyes 2 (two) times daily.  Dispense: 5 mL; Refill: 12  3. GAD (generalized anxiety disorder) - traZODone (DESYREL) 100 MG tablet; Take 1 tablet (100 mg total) by mouth at bedtime as needed.  Dispense: 90 tablet; Refill: 1  4. Gastroesophageal reflux disease without esophagitis - omeprazole (PRILOSEC) 20 MG capsule; Take 2 capsules (40 mg total) by mouth daily.  Dispense: 180 capsule; Refill: 1    Return if symptoms worsen or fail to improve.  The patient was given clear instructions to go to ER or return to medical center if symptoms don't improve, worsen or new problems develop. The patient verbalized understanding. The patient was told to call to get lab results if they haven't heard anything in the next week.      Dulce Gibbs, PA-C [facility] [place], [place] [phone]   08/11/2023, 1:03 PM

## 2023-08-11 NOTE — Patient Instructions (Signed)
 Simethicone Capsules or Tablets What is this medication? SIMETHICONE (sye METH i kone) treats the symptoms of gas, such as fullness, pressure, and bloating. It works by making it easier to pass gas through your digestive tract and exit your body. This medicine may be used for other purposes; ask your health care provider or pharmacist if you have questions. COMMON BRAND NAME(S): Gas Free, Gas Relief, Gas-X, Gas-X Extra Strength, Gas-X Ultra Strength, GasAid, Mylanta Gas, Phazyme, Phazyme ULTIMATE What should I tell my care team before I take this medication? They need to know if you have any of these conditions: An unusual or allergic reaction to simethicone, other medications, foods, dyes, or preservatives Pregnant or trying to get pregnant Breast-feeding How should I use this medication? Take this medication by mouth with a glass of water. Follow the directions on the label or those given to you by your care team. Do not take your medication more often than directed. Talk to your care team about the use of this medication in children. Special care may be needed. While this medication may be used in children as young as 12 years for selected conditions, precautions do apply. Overdosage: If you think you have taken too much of this medicine contact a poison control center or emergency room at once. NOTE: This medicine is only for you. Do not share this medicine with others. What if I miss a dose? This does not apply. You will only use this medication as needed for gas pain. Do not use double or extra doses. What may interact with this medication? Interactions are not expected. This list may not describe all possible interactions. Give your health care provider a list of all the medicines, herbs, non-prescription drugs, or dietary supplements you use. Also tell them if you smoke, drink alcohol, or use illegal drugs. Some items may interact with your medicine. What should I watch for while using  this medication? Tell your care team if your symptoms get worse, or if you have severe pain, diarrhea, constipation, or blood in your stool. These could be signs of a more serious condition. What side effects may I notice from receiving this medication? Side effects that you should report to your care team as soon as possible: Allergic reactions--skin rash, itching, hives, swelling of the face, lips, tongue, or throat This list may not describe all possible side effects. Call your doctor for medical advice about side effects. You may report side effects to FDA at 1-800-FDA-1088. Where should I keep my medication? Keep out of the reach of children. Store at room temperature between 15 and 30 degrees C (59 and 86 degrees F). Keep container tightly closed. Throw away any unused medication after the expiration date. NOTE: This sheet is a summary. It may not cover all possible information. If you have questions about this medicine, talk to your doctor, pharmacist, or health care provider.  2024 Elsevier/Gold Standard (2020-06-27 00:00:00)

## 2023-08-12 ENCOUNTER — Encounter: Payer: Self-pay | Admitting: Physician Assistant

## 2023-08-12 LAB — COMPREHENSIVE METABOLIC PANEL WITH GFR
ALT: 14 IU/L (ref 0–32)
AST: 13 IU/L (ref 0–40)
Albumin: 4.3 g/dL (ref 3.9–4.9)
Alkaline Phosphatase: 61 IU/L (ref 44–121)
BUN/Creatinine Ratio: 19 (ref 9–23)
BUN: 14 mg/dL (ref 6–20)
Bilirubin Total: 0.2 mg/dL (ref 0.0–1.2)
CO2: 22 mmol/L (ref 20–29)
Calcium: 9 mg/dL (ref 8.7–10.2)
Chloride: 105 mmol/L (ref 96–106)
Creatinine, Ser: 0.72 mg/dL (ref 0.57–1.00)
Globulin, Total: 2.2 g/dL (ref 1.5–4.5)
Glucose: 70 mg/dL (ref 70–99)
Potassium: 3.8 mmol/L (ref 3.5–5.2)
Sodium: 141 mmol/L (ref 134–144)
Total Protein: 6.5 g/dL (ref 6.0–8.5)
eGFR: 114 mL/min/{1.73_m2} (ref >59)

## 2023-08-12 LAB — LIPASE: Lipase: 38 U/L (ref 14–72)

## 2023-08-25 ENCOUNTER — Ambulatory Visit: Payer: Self-pay | Admitting: Physician Assistant

## 2023-08-25 ENCOUNTER — Encounter: Payer: Self-pay | Admitting: Physician Assistant

## 2023-08-25 VITALS — BP 97/62 | HR 70 | Ht 59.0 in | Wt 117.0 lb

## 2023-08-25 DIAGNOSIS — R141 Gas pain: Secondary | ICD-10-CM

## 2023-08-25 NOTE — Patient Instructions (Signed)
 WHOLE 30   Low-FODMAP Eating Plan  FODMAP stands for fermentable oligosaccharides, disaccharides, monosaccharides, and polyols. These are sugars that are hard for some people to digest. A low-FODMAP eating plan may help some people who have irritable bowel syndrome (IBS) and certain other bowel (intestinal) diseases to manage their symptoms. This meal plan can be complicated to follow. Work with a diet and nutrition specialist (dietitian) to make a low-FODMAP eating plan that is right for you. A dietitian can help make sure that you get enough nutrition from this diet. What are tips for following this plan? Reading food labels Check labels for hidden FODMAPs such as: High-fructose syrup. Honey. Agave. Natural fruit flavors. Onion or garlic powder. Choose low-FODMAP foods that contain 3-4 grams of fiber per serving. Check food labels for serving sizes. Eat only one serving at a time to make sure FODMAP levels stay low. Shopping Shop with a list of foods that are recommended on this diet and make a meal plan. Meal planning Follow a low-FODMAP eating plan for up to 6 weeks, or as told by your health care provider or dietitian. To follow the eating plan: Eliminate high-FODMAP foods from your diet completely. Choose only low-FODMAP foods to eat. You will do this for 2-6 weeks. Gradually reintroduce high-FODMAP foods into your diet one at a time. Most people should wait a few days before introducing the next new high-FODMAP food into their meal plan. Your dietitian can recommend how quickly you may reintroduce foods. Keep a daily record of what and how much you eat and drink. Make note of any symptoms that you have after eating. Review your daily record with a dietitian regularly to identify which foods you can eat and which foods you should avoid. General tips Drink enough fluid each day to keep your urine pale yellow. Avoid processed foods. These often have added sugar and may be high in  FODMAPs. Avoid most dairy products, whole grains, and sweeteners. Work with a dietitian to make sure you get enough fiber in your diet. Avoid high FODMAP foods at meals to manage symptoms. Recommended foods Fruits Bananas, oranges, tangerines, lemons, limes, blueberries, raspberries, strawberries, grapes, cantaloupe, honeydew melon, kiwi, papaya, passion fruit, and pineapple. Limited amounts of dried cranberries, banana chips, and shredded coconut. Vegetables Eggplant, zucchini, cucumber, peppers, green beans, bean sprouts, lettuce, arugula, kale, Swiss chard, spinach, collard greens, bok choy, summer squash, potato, and tomato. Limited amounts of corn, carrot, and sweet potato. Green parts of scallions. Grains Gluten-free grains, such as rice, oats, buckwheat, quinoa, corn, polenta, and millet. Gluten-free pasta, bread, or cereal. Rice noodles. Corn tortillas. Meats and other proteins Unseasoned beef, pork, poultry, or fish. Eggs. Helene Loader. Tofu (firm) and tempeh. Limited amounts of nuts and seeds, such as almonds, walnuts, [place] nuts, pecans, peanuts, nut butters, pumpkin seeds, chia seeds, and sunflower seeds. Dairy Lactose-free milk, yogurt, and kefir. Lactose-free cottage cheese and ice cream. Non-dairy milks, such as almond, coconut, hemp, and rice milk. Non-dairy yogurt. Limited amounts of goat cheese, brie, mozzarella, parmesan, swiss, and other hard cheeses. Fats and oils Butter-free spreads. Vegetable oils, such as olive, canola, and sunflower oil. Seasoning and other foods Artificial sweeteners with names that do not end in "ol," such as aspartame, saccharine, and stevia. Maple syrup, white table sugar, raw sugar, brown sugar, and molasses. Mayonnaise, soy sauce, and tamari. Fresh basil, coriander, parsley, rosemary, and thyme. Beverages Water  and mineral water . Sugar-sweetened soft drinks. Small amounts of orange juice or cranberry juice. Black and green  tea. Most dry wines.  Coffee. The items listed above may not be a complete list of foods and beverages you can eat. Contact a dietitian for more information. Foods to avoid Fruits Fresh, dried, and juiced forms of apple, pear, watermelon, peach, plum, cherries, apricots, blackberries, boysenberries, figs, nectarines, and mango. Avocado. Vegetables Chicory root, artichoke, asparagus, cabbage, snow peas, Brussels sprouts, broccoli, sugar snap peas, mushrooms, celery, and cauliflower. Onions, garlic, leeks, and the white part of scallions. Grains Wheat, including kamut, durum, and semolina. Barley and bulgur. Couscous. Wheat-based cereals. Wheat noodles, bread, crackers, and pastries. Meats and other proteins Fried or fatty meat. Sausage. Cashews and pistachios. Soybeans, baked beans, black beans, chickpeas, kidney beans, fava beans, navy beans, lentils, black-eyed peas, and split peas. Dairy Milk, yogurt, ice cream, and soft cheese. Cream and sour cream. Milk-based sauces. Custard. Buttermilk. Soy milk. Seasoning and other foods Any sugar-free gum or candy. Foods that contain artificial sweeteners such as sorbitol, mannitol, isomalt, or xylitol. Foods that contain honey, high-fructose corn syrup, or agave. Bouillon, vegetable stock, beef stock, and chicken stock. Garlic and onion powder. Condiments made with onion, such as hummus, chutney, pickles, relish, salad dressing, and salsa. Tomato paste. Beverages Chicory-based drinks. Coffee substitutes. Chamomile tea. Fennel tea. Sweet or fortified wines such as port or sherry. Diet soft drinks made with isomalt, mannitol, maltitol, sorbitol, or xylitol. Apple, pear, and mango juice. Juices with high-fructose corn syrup. The items listed above may not be a complete list of foods and beverages you should avoid. Contact a dietitian for more information. Summary FODMAP stands for fermentable oligosaccharides, disaccharides, monosaccharides, and polyols. These are sugars that are  hard for some people to digest. A low-FODMAP eating plan is a short-term diet that helps to ease symptoms of certain bowel diseases. The eating plan usually lasts up to 6 weeks. After that, high-FODMAP foods are reintroduced gradually and one at a time. This can help you find out which foods may be causing symptoms. A low-FODMAP eating plan can be complicated. It is best to work with a dietitian who has experience with this type of plan. This information is not intended to replace advice given to you by your health care provider. Make sure you discuss any questions you have with your health care provider. Document Revised: 08/31/2019 Document Reviewed: 08/31/2019 Elsevier Patient Education  2024 [employer].

## 2023-08-25 NOTE — Progress Notes (Signed)
 Established Patient Office Visit  Subjective   Patient ID: Alicia Cochran, female    DOB: 09-16-1990  Age: 33 y.o. MRN: [id number]  Chief Complaint  Patient presents with   Bloated    Patient states she is still feeling discomfort everytime she eats. She wakes up frequently throughout the night from stomach pains, feeling as if she needs to pass gas but she is unable to.    Discussed the use of AI scribe software for clinical note transcription with the patient, who gave verbal consent to proceed.  History of Present Illness   Alicia Cochran is a 33 year old female who presents with persistent abdominal pain and bloating.  She experiences abdominal pain and bloating that worsen with eating, leading to significant bloating and a distended abdomen. Her weight has increased from 114 to 117-118 pounds over the past two weeks. Dicyclomine  and simethicone  have not provided relief. She takes Trazodone  and omeprazole , with diarrhea beginning after starting omeprazole . She has difficulty passing gas and finds no relief from lying on her left side. Her bowel movement pattern is diarrhea. She maintains a healthy diet, takes probiotics, and has started exercising, including Pilates. Her sleep is generally good, except when bloated. She has no anxiety or nervousness and maintains adequate water  intake.   Past Medical History:  Diagnosis Date   Anal fissure    Anxiety    Bowel obstruction (HCC)    Cholestasis of pregnancy in third trimester    Chronic headaches    Hx of chlamydia infection    IBS (irritable bowel syndrome)    Ulcerative colitis (HCC)    Social History   Socioeconomic History   Marital status: Single    Spouse name: Not on file   Number of children: 1   Years of education: Not on file   Highest education level: Not on file  Occupational History   Occupation: server  Tobacco Use   Smoking status: Never    Passive exposure: Never   Smokeless tobacco:  Never  Vaping Use   Vaping status: Never Used  Substance and Sexual Activity   Alcohol use: No    Alcohol/week: 0.0 standard drinks of alcohol   Drug use: No   Sexual activity: Yes  Other Topics Concern   Not on file  Social History Narrative   ** Merged History Encounter **       Social Drivers of Corporate investment banker Strain: Not on file  Food Insecurity: Not on file  Transportation Needs: Not on file  Physical Activity: Not on file  Stress: Not on file  Social Connections: Not on file  Intimate Partner Violence: Not on file   Family History  Problem Relation Age of Onset   Asthma Mother    Diabetes Maternal Grandmother    Alcohol abuse Neg Hx    Arthritis Neg Hx    Birth defects Neg Hx    Cancer Neg Hx    COPD Neg Hx    Depression Neg Hx    Drug abuse Neg Hx    Early death Neg Hx    Hearing loss Neg Hx    Heart disease Neg Hx    Hyperlipidemia Neg Hx    Hypertension Neg Hx    Kidney disease Neg Hx    Learning disabilities Neg Hx    Mental illness Neg Hx    Mental retardation Neg Hx    Miscarriages / Stillbirths Neg Hx    Stroke Neg Hx  Vision loss Neg Hx    Varicose Veins Neg Hx    No Known Allergies  Review of Systems  Constitutional:  Negative for chills and fever.  HENT: Negative.    Eyes: Negative.   Respiratory:  Negative for shortness of breath.   Cardiovascular:  Negative for chest pain.  Gastrointestinal:  Positive for abdominal pain and diarrhea. Negative for nausea and vomiting.  Genitourinary:  Negative for dysuria, frequency and hematuria.  Musculoskeletal: Negative.   Skin: Negative.   Neurological: Negative.   Endo/Heme/Allergies: Negative.   Psychiatric/Behavioral: Negative.        Objective:     BP 97/62 (BP Location: Left Arm, Patient Position: Sitting, Cuff Size: Normal)   Pulse 70   Ht 4\' 11"  (1.499 m)   Wt 117 lb (53.1 kg)   LMP 08/21/2023   SpO2 97%   BMI 23.63 kg/m  BP Readings from Last 3 Encounters:   08/25/23 97/62  08/11/23 105/72  04/13/23 100/68   Wt Readings from Last 3 Encounters:  08/25/23 117 lb (53.1 kg)  08/11/23 114 lb 9.6 oz (52 kg)  04/13/23 118 lb (53.5 kg)    Physical Exam Vitals and nursing note reviewed.  Constitutional:      Appearance: Normal appearance.  HENT:     Head: Normocephalic and atraumatic.     Right Ear: External ear normal.     Left Ear: External ear normal.     Nose: Nose normal.     Mouth/Throat:     Mouth: Mucous membranes are moist.     Pharynx: Oropharynx is clear.  Eyes:     Extraocular Movements: Extraocular movements intact.     Conjunctiva/sclera: Conjunctivae normal.     Pupils: Pupils are equal, round, and reactive to light.  Cardiovascular:     Rate and Rhythm: Normal rate and regular rhythm.     Pulses: Normal pulses.     Heart sounds: Normal heart sounds.  Pulmonary:     Effort: Pulmonary effort is normal.     Breath sounds: Normal breath sounds.  Abdominal:     Tenderness: There is no abdominal tenderness.  Musculoskeletal:        General: Normal range of motion.     Cervical back: Normal range of motion and neck supple.  Skin:    General: Skin is warm and dry.  Neurological:     General: No focal deficit present.     Mental Status: She is alert and oriented to person, place, and time.  Psychiatric:        Mood and Affect: Mood normal.        Behavior: Behavior normal.        Thought Content: Thought content normal.        Judgment: Judgment normal.        Assessment & Plan:   Problem List Items Addressed This Visit   None Visit Diagnoses       Gas pain    -  Primary      1. Gas pain (Primary) Chronic left-sided abdominal bloating and pain with postprandial fullness. Previous treatments ineffective. Consider food sensitivities. Discussed dietary modifications to identify triggers. - Discuss Whole30 and FODMAP diets to identify food sensitivities. - Encourage dietary modification to identify  triggers. - Consider discontinuing omeprazole  to assess impact on symptoms, with plan to resume if no improvement. - Provide information on Whole30 and FODMAP diets.   I have reviewed the patient's medical history (PMH, PSH, Social History, Family  History, Medications, and allergies) , and have been updated if relevant. I spent 20 minutes reviewing chart and  face to face time with patient.    Return if symptoms worsen or fail to improve.    Etter Hermann Mayers, PA-C

## 2023-08-26 ENCOUNTER — Encounter: Payer: Self-pay | Admitting: Physician Assistant

## 2023-10-22 ENCOUNTER — Other Ambulatory Visit: Payer: Self-pay

## 2023-11-16 ENCOUNTER — Other Ambulatory Visit: Payer: Self-pay

## 2023-11-23 ENCOUNTER — Other Ambulatory Visit: Payer: Self-pay

## 2023-11-25 ENCOUNTER — Other Ambulatory Visit: Payer: Self-pay

## 2023-11-26 ENCOUNTER — Other Ambulatory Visit: Payer: Self-pay

## 2023-11-30 ENCOUNTER — Other Ambulatory Visit: Payer: Self-pay

## 2023-12-28 ENCOUNTER — Telehealth: Payer: Self-pay | Admitting: Internal Medicine

## 2023-12-28 NOTE — Telephone Encounter (Signed)
Pt confirmed appt 9/2

## 2023-12-29 ENCOUNTER — Other Ambulatory Visit: Payer: Self-pay

## 2023-12-29 ENCOUNTER — Ambulatory Visit: Payer: Self-pay | Attending: Nurse Practitioner | Admitting: Nurse Practitioner

## 2023-12-29 ENCOUNTER — Encounter: Payer: Self-pay | Admitting: Nurse Practitioner

## 2023-12-29 VITALS — BP 99/63 | HR 60 | Resp 19 | Ht 59.0 in | Wt 117.0 lb

## 2023-12-29 DIAGNOSIS — K5909 Other constipation: Secondary | ICD-10-CM

## 2023-12-29 DIAGNOSIS — R140 Abdominal distension (gaseous): Secondary | ICD-10-CM

## 2023-12-29 DIAGNOSIS — R1032 Left lower quadrant pain: Secondary | ICD-10-CM

## 2023-12-29 MED ORDER — POLYETHYLENE GLYCOL 3350 17 GM/SCOOP PO POWD
8.5000 g | Freq: Every day | ORAL | 1 refills | Status: AC
  Filled 2023-12-29: qty 476, 56d supply, fill #0

## 2023-12-29 NOTE — Progress Notes (Signed)
 Assessment & Plan:  [name] was seen today for abdominal pain.  Diagnoses and all orders for this visit:  Bloating symptom -     Ambulatory referral to Gastroenterology -     H. pylori breath test -     Small Intestinal Bacterial Overgrowth (SIBO); Future -     polyethylene glycol powder (GLYCOLAX /MIRALAX ) 17 GM/SCOOP powder; Take 8.5 g by mouth daily. Dissolve 1/2 capful (8.5g) in 4-8 ounces of liquid and take by mouth daily.  LLQ abdominal pain -     Ambulatory referral to Gastroenterology -     H. pylori breath test -     Small Intestinal Bacterial Overgrowth (SIBO); Future    Chronic constipation -     Ambulatory referral to Gastroenterology -     Small Intestinal Bacterial Overgrowth (SIBO); Future -     polyethylene glycol powder (GLYCOLAX /MIRALAX ) 17 GM/SCOOP powder; Take 8.5 g by mouth daily. Dissolve 1/2 capful (8.5g) in 4-8 ounces of liquid and take by mouth daily. Chronic constipation with bloating and pain likely due to constipation rather than IBS. Previous tests showed no obstruction or mass. Miralax  previously caused diarrhea, indicating possible dosing issues. H. pylori unlikely cause. SIBO considered but not tested due to lab limitations. - Prescribed Miralax , advised half a capful daily. - Referred to gastroenterology for further evaluation. - Ordered H. pylori test. - Provided information on SIBO testing availability outside [facility], suggested contacting [facility] or other labs. Patient is requesting this test   Patient has been counseled on age-appropriate routine health concerns for screening and prevention. These are reviewed and up-to-date. Referrals have been placed accordingly. Immunizations are up-to-date or declined.    Subjective:   Chief Complaint  Patient presents with   Abdominal Pain    History of Present Illness Alicia Cochran is a 33 year old female who presents with chronic bloating and constipation.  She is a patient of Dr.  Vicci  She has experienced stomach bloating for the past two years, particularly after eating. Her abdomen becomes significantly distended, even after consuming small amounts of food. The bloating is accompanied by pain, especially on the left side of her abdomen, which worsens at night after dinner or even after lunch, causing discomfort throughout the day. She has a history of constipation and does not have regular bowel movements. She is able to pass stool but feels she is not completely evacuating. She has tried various treatments, including laxatives and Miralax , but these have not provided relief. Miralax , when used, resulted in diarrhea rather than solid stools. She has also tried increasing her water  intake, consuming at least three bottles of water  a day, and eating a healthy diet rich in fruits. She underwent a colonoscopy 12-29-2022 in the past due to constipation and rectal bleeding. She has not had a follow-up with gastroenterology since the procedure. She also had an abdominal ultrasound and a CT scan, which did not reveal any obstructions or masses. She has not been formally diagnosed with irritable bowel syndrome (IBS), although she was previously given medication for it, which she stopped taking as it did not alleviate her symptoms. She is very active, often running around, but her symptoms of bloating and constipation significantly impact her daily life, making her feel 'miserable all day.'  Review of Systems  Constitutional:  Negative for fever, malaise/fatigue and weight loss.  HENT: Negative.  Negative for nosebleeds.   Eyes: Negative.  Negative for blurred vision, double vision and photophobia.  Respiratory: Negative.  Negative  for cough and shortness of breath.   Cardiovascular: Negative.  Negative for chest pain, palpitations and leg swelling.  Gastrointestinal:  Positive for abdominal pain and constipation. Negative for blood in stool, diarrhea, heartburn, melena, nausea and  vomiting.  Musculoskeletal: Negative.  Negative for myalgias.  Neurological: Negative.  Negative for dizziness, focal weakness, seizures and headaches.  Psychiatric/Behavioral: Negative.  Negative for suicidal ideas.     Past Medical History:  Diagnosis Date   Anal fissure    Anxiety    Bowel obstruction (HCC)    Cholestasis of pregnancy in third trimester    Chronic headaches    Hx of chlamydia infection    IBS (irritable bowel syndrome)    Ulcerative colitis (HCC)     Past Surgical History:  Procedure Laterality Date   CYSTO WITH HYDRODISTENSION N/A 10/07/2018   Procedure: CYSTOSCOPY/HYDRODISTENSION INSTILL MARCAINE  AND PYRIDIUM ;  Surgeon: Watt Rush, MD;  Location: [facility];  Service: Urology;  Laterality: N/A;   expanded bladder      Family History  Problem Relation Age of Onset   Asthma Mother    Diabetes Maternal Grandmother    Alcohol abuse Neg Hx    Arthritis Neg Hx    Birth defects Neg Hx    Cancer Neg Hx    COPD Neg Hx    Depression Neg Hx    Drug abuse Neg Hx    Early death Neg Hx    Hearing loss Neg Hx    Heart disease Neg Hx    Hyperlipidemia Neg Hx    Hypertension Neg Hx    Kidney disease Neg Hx    Learning disabilities Neg Hx    Mental illness Neg Hx    Mental retardation Neg Hx    Miscarriages / Stillbirths Neg Hx    Stroke Neg Hx    Vision loss Neg Hx    Varicose Veins Neg Hx     Social History Reviewed with no changes to be made today.   Outpatient Medications Prior to Visit  Medication Sig Dispense Refill   loratadine  (CLARITIN ) 10 MG tablet Take 1 tablet (10 mg total) by mouth daily. 30 tablet 3   omeprazole  (PRILOSEC) 20 MG capsule Take 2 capsules (40 mg total) by mouth daily. 180 capsule 1   fluticasone  (FLONASE ) 50 MCG/ACT nasal spray Place 2 sprays into both nostrils daily. 16 g 6   olopatadine  (PATANOL) 0.1 % ophthalmic solution Place 1 drop into both eyes 2 (two) times daily. 5 mL 12   Simethicone  125 MG CAPS Take 1 capsule (125 mg  total) by mouth 3 (three) times daily as needed. (Patient not taking: Reported on 12/29/2023) 90 capsule 2   traZODone  (DESYREL ) 100 MG tablet Take 1 tablet (100 mg total) by mouth at bedtime as needed. (Patient not taking: Reported on 12/29/2023) 90 tablet 1   buPROPion  (WELLBUTRIN  XL) 150 MG 24 hr tablet Take 1 tablet (150 mg total) by mouth daily. (Patient not taking: Reported on 08/25/2023) 30 tablet 1   dicyclomine  (BENTYL ) 10 MG capsule Take 1 capsule (10 mg total) by mouth 4 (four) times daily -  before meals and at bedtime AS NEEDED FOR COLICKY TYPE PAIN. (Patient not taking: Reported on 12/29/2023) 30 capsule 3   hydrOXYzine  (ATARAX ) 25 MG tablet Take 0.5-1 tablets (12.5-25 mg total) by mouth for feelings of severe anxiety up to 3 times daily.  (Use sparingly) (Patient not taking: Reported on 08/25/2023) 270 tablet 0   ibuprofen  (ADVIL ) 600 MG  tablet Take 1 tablet (600 mg total) by mouth every 8 (eight) hours as needed. (Patient not taking: Reported on 08/25/2023) 30 tablet 0   No facility-administered medications prior to visit.    No Known Allergies     Objective:    BP 99/63 (BP Location: Left Arm, Patient Position: Sitting, Cuff Size: Normal)   Pulse 60   Resp 19   Ht 4' 11 (1.499 m)   Wt 117 lb (53.1 kg)   LMP 12/26/2023 (Approximate)   SpO2 100%   BMI 23.63 kg/m  Wt Readings from Last 3 Encounters:  12/29/23 117 lb (53.1 kg)  08/25/23 117 lb (53.1 kg)  08/11/23 114 lb 9.6 oz (52 kg)    Physical Exam Vitals and nursing note reviewed.  Constitutional:      Appearance: She is well-developed.  HENT:     Head: Normocephalic and atraumatic.  Cardiovascular:     Rate and Rhythm: Normal rate and regular rhythm.     Heart sounds: Normal heart sounds. No murmur heard.    No friction rub. No gallop.  Pulmonary:     Effort: Pulmonary effort is normal. No tachypnea or respiratory distress.     Breath sounds: Normal breath sounds. No decreased breath sounds, wheezing, rhonchi or  rales.  Chest:     Chest wall: No tenderness.  Abdominal:     General: Bowel sounds are normal.     Palpations: Abdomen is soft.  Musculoskeletal:        General: Normal range of motion.     Cervical back: Normal range of motion.  Skin:    General: Skin is warm and dry.  Neurological:     Mental Status: She is alert and oriented to person, place, and time.     Coordination: Coordination normal.  Psychiatric:        Behavior: Behavior normal. Behavior is cooperative.        Thought Content: Thought content normal.        Judgment: Judgment normal.          Patient has been counseled extensively about nutrition and exercise as well as the importance of adherence with medications and regular follow-up. The patient was given clear instructions to go to ER or return to medical center if symptoms don't improve, worsen or new problems develop. The patient verbalized understanding.   Follow-up: Return if symptoms worsen or fail to improve.   Haze LELON Servant, FNP-BC [facility] and [facility] [place], [place] [phone]   12/29/2023, 10:35 AM

## 2023-12-29 NOTE — Progress Notes (Signed)
 Patient states that she has been experiencing bloating with whatever she eats.  Constipation  Pain on left side of abdomen

## 2023-12-31 ENCOUNTER — Ambulatory Visit: Payer: Self-pay | Admitting: Nurse Practitioner

## 2023-12-31 LAB — H. PYLORI BREATH TEST: H pylori Breath Test: NEGATIVE

## 2023-12-31 LAB — H. PYLORI BREATH COLLECTION

## 2024-01-06 ENCOUNTER — Other Ambulatory Visit: Payer: Self-pay

## 2024-01-06 ENCOUNTER — Encounter: Payer: Self-pay | Admitting: Nurse Practitioner

## 2024-01-06 ENCOUNTER — Ambulatory Visit (INDEPENDENT_AMBULATORY_CARE_PROVIDER_SITE_OTHER): Payer: Self-pay | Admitting: Nurse Practitioner

## 2024-01-06 ENCOUNTER — Other Ambulatory Visit (INDEPENDENT_AMBULATORY_CARE_PROVIDER_SITE_OTHER): Payer: Self-pay

## 2024-01-06 VITALS — BP 112/64 | HR 70 | Ht 59.0 in | Wt 118.5 lb

## 2024-01-06 DIAGNOSIS — R1032 Left lower quadrant pain: Secondary | ICD-10-CM

## 2024-01-06 DIAGNOSIS — K5900 Constipation, unspecified: Secondary | ICD-10-CM

## 2024-01-06 DIAGNOSIS — R109 Unspecified abdominal pain: Secondary | ICD-10-CM

## 2024-01-06 DIAGNOSIS — K5909 Other constipation: Secondary | ICD-10-CM

## 2024-01-06 DIAGNOSIS — R140 Abdominal distension (gaseous): Secondary | ICD-10-CM

## 2024-01-06 LAB — CBC
HCT: 41.7 % (ref 36.0–46.0)
Hemoglobin: 14 g/dL (ref 12.0–15.0)
MCHC: 33.6 g/dL (ref 30.0–36.0)
MCV: 93 fl (ref 78.0–100.0)
Platelets: 237 K/uL (ref 150.0–400.0)
RBC: 4.48 Mil/uL (ref 3.87–5.11)
RDW: 13.6 % (ref 11.5–15.5)
WBC: 7.2 K/uL (ref 4.0–10.5)

## 2024-01-06 MED ORDER — LINACLOTIDE 72 MCG PO CAPS
72.0000 ug | ORAL_CAPSULE | Freq: Every day | ORAL | 1 refills | Status: AC
  Filled 2024-01-06: qty 30, 30d supply, fill #0

## 2024-01-06 NOTE — Patient Instructions (Signed)
 You have been given a testing kit to check for small intestine bacterial overgrowth (SIBO) which is completed by a company named [facility]. Make sure to return your test in the mail using the return mailing label given to you along with the kit. The test order, your demographic and insurance information have all already been sent to the company. [facility] will collect an upfront charge of $109.00 for commercial insurance plans and $229.00 if you are paying cash. The potential remaining total after claim submission and review is $120.00. Make sure to discuss with [facility] PRIOR to having the test to see if they have gotten information from your insurance company as to how much your testing will cost out of pocket, if any. Please contact [facility] at phone number [phone] to get instructions regarding how to perform the test as our office is unable to give specific testing instructions.   Your provider has requested that you go to the basement level for lab work before leaving today. Press B on the elevator. The lab is located at the first door on the left as you exit the elevator.  Due to recent changes in healthcare laws, you may see the results of your imaging and laboratory studies on MyChart before your provider has had a chance to review them.  We understand that in some cases there may be results that are confusing or concerning to you. Not all laboratory results come back in the same time frame and the provider may be waiting for multiple results in order to interpret others.  Please give us  48 hours in order for your provider to thoroughly review all the results before contacting the office for clarification of your results.   _______________________________________________________  If your blood pressure at your visit was 140/90 or greater, please contact your primary care physician to follow up on this.  _______________________________________________________  If  you are age 31 or older, your body mass index should be between 23-30. Your Body mass index is 23.93 kg/m. If this is out of the aforementioned range listed, please consider follow up with your Primary Care Provider.  If you are age 73 or younger, your body mass index should be between 19-25. Your Body mass index is 23.93 kg/m. If this is out of the aformentioned range listed, please consider follow up with your Primary Care Provider.   ________________________________________________________  The [facility] providers would like to encourage you to use MYCHART to communicate with providers for non-urgent requests or questions.  Due to long hold times on the telephone, sending your provider a message by [facility] may be a faster and more efficient way to get a response.  Please allow 48 business hours for a response.  Please remember that this is for non-urgent requests.  _______________________________________________________  [facility] is using a team-based approach to care.  Your team is made up of your doctor and two to three APPS. Our APPS (Nurse Practitioners and Physician Assistants) work with your physician to ensure care continuity for you. They are fully qualified to address your health concerns and develop a treatment plan. They communicate directly with your gastroenterologist to care for you. Seeing the Advanced Practice Practitioners on your physician's team can help you by facilitating care more promptly, often allowing for earlier appointments, access to diagnostic testing, procedures, and other specialty referrals.   Thank you for trusting me with your gastrointestinal care!   Elida Shawl, CRNP

## 2024-01-06 NOTE — Progress Notes (Unsigned)
     01/06/2024 Alicia Cochran [id number] 1990/09/14   Chief Complaint:  History of Present Illness:    2023 abdominal bloat constipation 09/2021 first started. Omeprazole  on an empty stomach since 12/2021.   Feels flull, bloated, eats gets  bloated.   She is passing soft stool small once daily, doesn't fee emptied. No blood since her colonoscopy.  Started Miralax  once weekly. Prune juice one week ago, diarrhea.   Liquid Dulcolax was terrible.   History of H. Pylori  Exercises Drinks 3 or 4 bottles of water  16 oz.  Citrucel x 3 months  Colonoscopy 12/29/2022.  - The entire examined colon is normal on direct and retroflexion views. - Internal hemorrhoids. - No specimens collected.  Tries to pass gas but sometimes can't. Left side.   Decreased  Greek yogurt   Current Medications, Allergies, Past Medical History, Past Surgical History, Family History and Social History were reviewed in [employer] record.   Review of Systems:   Constitutional: Negative for fever, sweats, chills or weight loss.  Respiratory: Negative for shortness of breath.   Cardiovascular: Negative for chest pain, palpitations and leg swelling.  Gastrointestinal: See HPI.  Musculoskeletal: Negative for back pain or muscle aches.  Neurological: Negative for dizziness, headaches or paresthesias.    Physical Exam: BP 112/64   Pulse 70   Ht 4' 11 (1.499 m)   Wt 118 lb 8 oz (53.8 kg)   LMP 12/26/2023 (Approximate)   BMI 23.93 kg/m  General: in no acute distress. Head: Normocephalic and atraumatic. Eyes: No scleral icterus. Conjunctiva pink . Ears: Normal auditory acuity. Mouth: Dentition intact. No ulcers or lesions.  Lungs: Clear throughout to auscultation. Heart: Regular rate and rhythm, no murmur. Abdomen: Soft. Mild lower abdominal tenderness without rebound or guarding. Nondistended. No masses or hepatomegaly. Normal bowel sounds x 4 quadrants.  Rectal:  Deferred.  Musculoskeletal: Symmetrical with no gross deformities. Extremities: No edema. Neurological: Alert oriented x 4. No focal deficits.  Psychological: Alert and cooperative. Normal mood and affect  Assessment and Recommendations: ***

## 2024-01-07 ENCOUNTER — Ambulatory Visit: Payer: Self-pay | Admitting: Nurse Practitioner

## 2024-01-07 LAB — TISSUE TRANSGLUTAMINASE, IGA: (tTG) Ab, IgA: 1 U/mL

## 2024-01-07 LAB — IGA: Immunoglobulin A: 213 mg/dL (ref 47–310)

## 2024-01-07 NOTE — Progress Notes (Signed)
 Noted

## 2024-01-14 ENCOUNTER — Other Ambulatory Visit: Payer: Self-pay

## 2024-02-14 ENCOUNTER — Telehealth: Payer: Self-pay | Admitting: Nurse Practitioner

## 2024-02-14 NOTE — Telephone Encounter (Signed)
 [name], this is a patient of Dr. Nancyann. I saw her in office 01/06/2024. Pls contact her and let her know her SIBO breath test was positive for small intestinal bacterial overgrowth.  Please send a prescription for Xifaxan 550 mg 1 capsule p.o. 3 times daily for 14 days # 42, no refills and Florastor probiotic one capsule po bid x 14 days # 28, no refills. THX.

## 2024-02-15 NOTE — Telephone Encounter (Signed)
 Patient appears to be uninsured so Xifixan will probably be cost prohibitive.  I am trying to get samples for her.

## 2024-02-15 NOTE — Telephone Encounter (Signed)
 Spoke to patient and let her know that her breath test was positive for SIBO and that I was trying to get samples for her.  I told her I would call her back when I received them, hopefully this week.  Patient agreed.

## 2024-02-15 NOTE — Telephone Encounter (Signed)
Thank you Leslie!

## 2024-02-17 NOTE — Telephone Encounter (Signed)
 Inbound call from patient requesting to know if she is able to get a medication for her positive SIBO breathe test. Patient stated that she has not heard from [place] since Tuesday. Please advise.

## 2024-02-17 NOTE — Telephone Encounter (Signed)
 Lm on vm that I have been waiting for the pharmaceutical rep to bring the samples of Xifaxan to me - she is planning to bring them tomorrow, at which point I will call patient to come pick them up

## 2024-02-21 ENCOUNTER — Encounter: Payer: Self-pay | Admitting: Nurse Practitioner

## 2024-02-21 NOTE — Telephone Encounter (Signed)
 Spoke with patient and told her there was no particular diet to follow while on Xifaxan.  She will call us  back if she continues to have problems after finishing course

## 2024-02-21 NOTE — Telephone Encounter (Signed)
 Inbound call from patient stating she would like to know if theres a specific diet she has to follow with medication xifaxan  Requesting a call back  Please advise  Thank you

## 2024-02-21 NOTE — Telephone Encounter (Signed)
 Lm on vm that I finally received the Xifaxan samples and would put them up front on the 3rd floor to be picked up.  Instructed her to take one tablet 3 times a day for 2 weeks.

## 2024-02-29 ENCOUNTER — Telehealth: Payer: Self-pay | Admitting: Nurse Practitioner

## 2024-02-29 NOTE — Telephone Encounter (Signed)
 Pt states she is taking xifaxan for sibo but she is still having pain in her LLQ and has had some constipation. She wanted to know if she should finish the 14 days of xifaxan. Discussed with pt that she should finish the 14 days of xifaxan and that she could add miralax  1-3 doses daily for constipation. Pt verbalized understanding.

## 2024-02-29 NOTE — Telephone Encounter (Signed)
 Inbound call from patient stating she has been taking Xifaxan due to positive SIBO breath test and it has not been helping patient stated she's having the same issues and medication has even made her constipated. Patient would like to speak to someone and be advised on what to do. Requesting a call back  Please advise  Thank you

## 2024-03-08 ENCOUNTER — Telehealth: Payer: Self-pay | Admitting: Nurse Practitioner

## 2024-03-08 NOTE — Telephone Encounter (Signed)
 Left message for pt to call back

## 2024-03-08 NOTE — Telephone Encounter (Signed)
 Pt states she has finished the xifaxan that she was given and she is still having issues with LLQ abd pain and diarrhea alternating with constipation. She does not feel any better. Pt scheduled to see Dr. Abran 03/10/24 at 9:20am, pt aware of appt.

## 2024-03-08 NOTE — Telephone Encounter (Signed)
 Inbound call from patient requesting to speak to the [facility] in regards to her antibiotic. Patient stated she finished her medication, but is still having abdominal pain. Patient is requesting a call back.

## 2024-03-08 NOTE — Telephone Encounter (Signed)
 Pt took Xifaxan - this sounds triage-y

## 2024-03-10 ENCOUNTER — Ambulatory Visit (INDEPENDENT_AMBULATORY_CARE_PROVIDER_SITE_OTHER): Payer: Self-pay | Admitting: Internal Medicine

## 2024-03-10 ENCOUNTER — Encounter: Payer: Self-pay | Admitting: Internal Medicine

## 2024-03-10 ENCOUNTER — Other Ambulatory Visit: Payer: Self-pay

## 2024-03-10 VITALS — BP 98/60 | HR 69 | Ht 59.0 in | Wt 113.0 lb

## 2024-03-10 DIAGNOSIS — R194 Change in bowel habit: Secondary | ICD-10-CM

## 2024-03-10 DIAGNOSIS — R198 Other specified symptoms and signs involving the digestive system and abdomen: Secondary | ICD-10-CM

## 2024-03-10 DIAGNOSIS — R1032 Left lower quadrant pain: Secondary | ICD-10-CM

## 2024-03-10 DIAGNOSIS — K638219 Small intestinal bacterial overgrowth, unspecified: Secondary | ICD-10-CM

## 2024-03-10 DIAGNOSIS — R140 Abdominal distension (gaseous): Secondary | ICD-10-CM

## 2024-03-10 MED ORDER — METRONIDAZOLE 250 MG PO TABS
250.0000 mg | ORAL_TABLET | Freq: Three times a day (TID) | ORAL | 1 refills | Status: AC
  Filled 2024-03-10: qty 30, 10d supply, fill #0
  Filled 2024-03-21: qty 30, 10d supply, fill #1

## 2024-03-10 NOTE — Progress Notes (Signed)
 HISTORY OF PRESENT ILLNESS:  Alicia Cochran is a 33 y.o. female with chronic abdominal pain and alternating bowel habits.  Colonoscopy September 2024 was normal.  CT scan February 2019 was negative except for a right ovarian cyst.  She has been seen in this office on several occasions regarding complaints of abdominal pain and bloating.  A number of different remedies have not been helpful.  Blood work in September and April of this year has been unremarkable.  Last office visit January 06, 2024.  Was given Linzess .  She states this did not help.  Underwent SIBO testing.  Was positive.  Completed 2 weeks of Xifaxan.  She states her symptoms came back 1 or 2 days after completing antibiotic.  Ongoing complaints of the same.  REVIEW OF SYSTEMS:  All non-GI ROS negative. Past Medical History:  Diagnosis Date   Anal fissure    Anxiety    Bowel obstruction (HCC)    Cholestasis of pregnancy in third trimester    Chronic headaches    Hx of chlamydia infection    IBS (irritable bowel syndrome)    Ulcerative colitis (HCC)     Past Surgical History:  Procedure Laterality Date   CYSTO WITH HYDRODISTENSION N/A 10/07/2018   Procedure: CYSTOSCOPY/HYDRODISTENSION INSTILL MARCAINE  AND PYRIDIUM ;  Surgeon: Watt Rush, MD;  Location: [facility];  Service: Urology;  Laterality: N/A;   expanded bladder      Social History Alicia Cochran  reports that she has never smoked. She has never been exposed to tobacco smoke. She has never used smokeless tobacco. She reports that she does not drink alcohol and does not use drugs.  family history includes Asthma in her mother; Diabetes in her maternal grandmother.  No Known Allergies     PHYSICAL EXAMINATION: Vital signs: BP 98/60   Pulse 69   Ht 4' 11 (1.499 m)   Wt 113 lb (51.3 kg)   BMI 22.82 kg/m   Constitutional: generally well-appearing, no acute distress Psychiatric: alert and oriented x3, cooperative Eyes: extraocular movements  intact, anicteric, conjunctiva pink Mouth: oral pharynx moist, no lesions Neck: supple no lymphadenopathy Cardiovascular: heart regular rate and rhythm, no murmur Lungs: clear to auscultation bilaterally Abdomen: soft, complains of some tenderness in the very low left abdomen/pelvic region, nondistended, no obvious ascites, no peritoneal signs, normal bowel sounds, no organomegaly Rectal: Omitted Extremities: no lower extremity edema bilaterally Skin: no lesions on visible extremities Neuro: No focal deficits.  Cranial nerves intact  ASSESSMENT:  1.  Chronic abdominal/pelvic pain with complaints of bloating.  Negative extensive workup.  Possible SIBO.  Treated response on therapy but immediate recurrence after completing therapy. 2.  Normal colonoscopy 2024  PLAN:  1.  Will try 1 additional trial of antibiotic for possible SIBO.  Prescribed Flagyl  250 mg p.o. 3 times daily x 10 days.  1 refill should she have a sustained response and then relapse 2.  Patient has been advised to see her gynecologist regarding chronic left lower pelvic pain 3.  Will obtain CT scan of the abdomen pelvis to further assess pain looking for non-GI entities A total time of 40 minutes was spent preparing to see the patient, obtaining interval history, performing medically appropriate physical exam, counseling and educating the patient regarding the above listed issues, ordering medication, ordering CT imaging, and documenting clinical information in the health record

## 2024-03-10 NOTE — Patient Instructions (Signed)
 We have sent the following medications to your pharmacy for you to pick up at your convenience:  Flagyl   You have been scheduled for a CT scan of the abdomen and pelvis at [facility], 1st floor Radiology. You are scheduled on 03/15/2024 at 5:00pm. You should arrive at 3:00pm prior to your appointment time for registration.   You may take any medications as prescribed with a small amount of water , if necessary. If you take any of the following medications: METFORMIN, GLUCOPHAGE, GLUCOVANCE, AVANDAMET, RIOMET, FORTAMET, ACTOPLUS MET, JANUMET, GLUMETZA or METAGLIP, you MAY be asked to HOLD this medication 48 hours AFTER the exam.   If you have any questions regarding your exam or if you need to reschedule, you may call [facility] at [phone] between the hours of 8:00 am and 5:00 pm, Monday-Friday.   _______________________________________________________  If your blood pressure at your visit was 140/90 or greater, please contact your primary care physician to follow up on this.  _______________________________________________________  If you are age 33 or older, your body mass index should be between 23-30. Your Body mass index is 22.82 kg/m. If this is out of the aforementioned range listed, please consider follow up with your Primary Care Provider.  If you are age 33 or younger, your body mass index should be between 19-25. Your Body mass index is 22.82 kg/m. If this is out of the aformentioned range listed, please consider follow up with your Primary Care Provider.   ________________________________________________________  The [facility] providers would like to encourage you to use MYCHART to communicate with providers for non-urgent requests or questions.  Due to long hold times on the telephone, sending your provider a message by [facility] may be a faster and more efficient way to get a response.  Please allow 48 business hours for a response.  Please remember that this  is for non-urgent requests.  _______________________________________________________  [facility] is using a team-based approach to care.  Your team is made up of your doctor and two to three APPS. Our APPS (Nurse Practitioners and Physician Assistants) work with your physician to ensure care continuity for you. They are fully qualified to address your health concerns and develop a treatment plan. They communicate directly with your gastroenterologist to care for you. Seeing the Advanced Practice Practitioners on your physician's team can help you by facilitating care more promptly, often allowing for earlier appointments, access to diagnostic testing, procedures, and other specialty referrals.

## 2024-03-13 ENCOUNTER — Other Ambulatory Visit: Payer: Self-pay

## 2024-03-13 DIAGNOSIS — K5909 Other constipation: Secondary | ICD-10-CM

## 2024-03-13 DIAGNOSIS — R140 Abdominal distension (gaseous): Secondary | ICD-10-CM

## 2024-03-13 DIAGNOSIS — R1032 Left lower quadrant pain: Secondary | ICD-10-CM

## 2024-03-15 ENCOUNTER — Ambulatory Visit (HOSPITAL_COMMUNITY): Payer: Self-pay

## 2024-03-21 ENCOUNTER — Other Ambulatory Visit: Payer: Self-pay

## 2024-03-22 ENCOUNTER — Telehealth: Payer: Self-pay | Admitting: Internal Medicine

## 2024-03-22 DIAGNOSIS — M722 Plantar fascial fibromatosis: Secondary | ICD-10-CM

## 2024-03-22 NOTE — Telephone Encounter (Signed)
 Copied from CRM [id number]. Topic: Referral - Question >> Mar 22, 2024 12:54 PM Alicia Cochran wrote: Reason for CRM: Patient states she is needing a referral to a podiatrist for Plantar fasciitis. States she has discussed this with the provider before, please advise if appt is needed. # [phone]

## 2024-03-22 NOTE — Telephone Encounter (Signed)
Podiatry referral submitted.

## 2024-03-22 NOTE — Addendum Note (Signed)
 Addended by: VICCI SOBER B on: 03/22/2024 05:21 PM   Modules accepted: Orders

## 2024-03-26 ENCOUNTER — Telehealth: Payer: Self-pay | Admitting: Internal Medicine

## 2024-03-26 MED ORDER — FLUCONAZOLE 150 MG PO TABS: 150.0000 mg | ORAL_TABLET | Freq: Once | ORAL | 0 refills | Status: DC

## 2024-03-26 MED ORDER — FLUCONAZOLE 150 MG PO TABS: ORAL_TABLET | ORAL | 0 refills | Status: AC

## 2024-03-26 NOTE — Telephone Encounter (Signed)
 Patient w/ sxs c/w vulvovaginal Candidiasis secondary to metronidazole  - had problems w/ Xifaxan also Tried OTC and no help  Meds ordered this encounter  Medications   fluconazole  (DIFLUCAN ) 150 MG tablet    Sig: Take 1 tablet (150 mg total) by mouth once for 1 dose.    Dispense:  1 tablet    Refill:  0   fluconazole  (DIFLUCAN ) 150 MG tablet    Sig: Take 1 tablet (150 mg total) by mouth once for 1 dose. Take at end of metronidazole  course    Dispense:  1 tablet    Refill:  0    Second Rx - to get # 2 fluconazole  150 mg total

## 2024-03-26 NOTE — Telephone Encounter (Signed)
 Had to change Rx to 24 hr pharmacy

## 2024-03-27 ENCOUNTER — Telehealth: Payer: Self-pay | Admitting: Gastroenterology

## 2024-03-27 MED ORDER — HYDROCORTISONE ACETATE 25 MG RE SUPP: 25.0000 mg | Freq: Two times a day (BID) | RECTAL | 0 refills | Status: AC

## 2024-03-27 NOTE — Telephone Encounter (Signed)
 Called & spoke to the patient. Verified name & DOB. Informed that a podiatry referral was submitted. Patient expressed verbal understanding.

## 2024-03-27 NOTE — Telephone Encounter (Signed)
 Received call from patient this evening the answering service. Called patient back. Patient describes starting Saturday she was experiencing significant pain in the perineum anal region.  She describes it planed hemorrhoids.  Started Preparation H suppositories and Preparation H ointment/cream. Using suppositories daily. Using Preparation H 3-4 times per day. She is not taking anything for pain. Patient not having any fevers or chills. She states she has had this happen in the past and after up to 2 weeks the discomfort will eventually go away. She is concerned that she could have this versus if something worse is occurring. She feels that things have been exacerbated as a result of the Flagyl  which has caused the patient to have alternating bowel habits of constipation and diarrhea. Recommendations are as follows: 1) Preparation H to be continued 3-4 times daily 2) RectiCare lidocaine  jelly to be applied to area 2 times daily 3) Anusol  suppositories to be sent to pharmacy to be used twice daily 4) Tylenol  up to 3000 mg can be used daily to help with pain/discomfort at this time 5) if pain progresses significantly in the setting of these treatments, she will need to be evaluated in the emergency department or urgent care for further evaluation to ensure that she does not have a thrombosed hemorrhoid which could require additional therapies 6) will forward this to patient's primary GI so his team can reach out to patient and follow-up tomorrow  She agrees to this plan of action. Will send prescription to [facility] pharmacy.   Aloha Finner, MD [facility] Office # [phone]

## 2024-03-28 ENCOUNTER — Telehealth: Payer: Self-pay | Admitting: Internal Medicine

## 2024-03-28 DIAGNOSIS — G8929 Other chronic pain: Secondary | ICD-10-CM

## 2024-03-28 NOTE — Telephone Encounter (Unsigned)
 Copied from CRM #[id number]. Topic: Referral - Question >> Mar 28, 2024  2:39 PM Alicia Cochran wrote: Reason for CRM: Patient is requesting a OBGYN referral not GI, she has seen a GI provider and they suggested she also see a OBGYN.  Please give patient a call.

## 2024-03-28 NOTE — Telephone Encounter (Signed)
 I have spoken to patient who states that she is not feeling much better and is still having perineal pain/hemorrhoids. She is advised that if she has worsening/significant pain, she should seek care at an urgent care or emergency room to ensure she does not have thrombosed hemorrhoids. She is also advised that she should follow with GYN about her chronic perineal pain. Patient says she does not have a GYN  but is advised that as long as insurance does not require she get a referral from PCP, she may be able to call and self schedule with GYN. She verbalizes understanding

## 2024-03-28 NOTE — Telephone Encounter (Signed)
 Copied from CRM #[id number]. Topic: Referral - Request for Referral >> Mar 28, 2024 12:55 PM Gustabo D wrote:  Did the patient discuss referral with their provider in the last year? No (If No - schedule appointment) (If Yes - send message)  Appointment offered? Yes pt declined  Type of order/referral and detailed reason for visit: due to stomach problems pain on left side of stomach she has been dealing with this for 3 yrs  Preference of office, provider, location: [facility] or whoever she can be referred to  If referral order, have you been seen by this specialty before? Yes in 2016 when she was pregnant  (If Yes, this issue or another issue? When? Where?  Can we respond through MyChart? Yes

## 2024-03-29 ENCOUNTER — Ambulatory Visit (HOSPITAL_COMMUNITY)
Admission: EM | Admit: 2024-03-29 | Discharge: 2024-03-29 | Disposition: A | Discharge status: Home / Self Care | Payer: Self-pay | Attending: Family Medicine | Admitting: Family Medicine

## 2024-03-29 ENCOUNTER — Encounter (HOSPITAL_COMMUNITY): Payer: Self-pay

## 2024-03-29 DIAGNOSIS — K644 Residual hemorrhoidal skin tags: Secondary | ICD-10-CM

## 2024-03-29 NOTE — ED Provider Notes (Signed)
 [facility]   [id number] 03/29/24 Arrival Time: 0800  ASSESSMENT & PLAN:  1. External hemorrhoid    Able to reduce hemorrhoid without difficulty. No bleeding. Long-standing problem.  Follow-up Information     Schedule an appointment as soon as possible for a visit  with Surgery, [place].   Specialty: General Surgery Why: If you want to talk to a surgeon about your recurrent hemorrhoids. Contact information: [address] [place] [address] [phone]                She reports that her stools are now fairly soft.    Discharge Instructions      Occasional use of over the counter Preparation H with Lidocaine  cream may help when your hemorrhoid is very painful.      Reviewed expectations re: course of current medical issues. Questions answered. Outlined signs and symptoms indicating need for more acute intervention. Patient verbalized understanding. After Visit Summary given.   SUBJECTIVE: History from: patient. Lesette Frary is a 33 y.o. female who presents with complaint of long-standing problem with hemorrhoids; > 10 years off/on; 5 d ago with acute hemorrhoid; painful with bleeding; pain has eased a bit; denies current bleeding. Has had normal colonoscopy 2024.  Patient's last menstrual period was 03/13/2024 (approximate). Past Surgical History:  Procedure Laterality Date   CYSTO WITH HYDRODISTENSION N/A 10/07/2018   Procedure: CYSTOSCOPY/HYDRODISTENSION INSTILL MARCAINE  AND PYRIDIUM ;  Surgeon: Watt Rush, MD;  Location: [facility];  Service: Urology;  Laterality: N/A;   expanded bladder       OBJECTIVE:  Vitals:   03/29/24 0816  BP: 106/72  Pulse: 71  Resp: 18  Temp: 98.1 F (36.7 C)  TempSrc: Oral  SpO2: 98%    General appearance: alert, oriented, no acute distress Rectal: (nurse chaperone present) approx 1cm non-inflamed hemorrhoid present; able to manually reduce; without bleeding Psychological:  alert and cooperative; normal mood and affect  Labs Reviewed: Results for orders placed or performed in visit on 01/06/24  CBC   Collection Time: 01/06/24  4:02 PM  Result Value Ref Range   WBC 7.2 4.0 - 10.5 K/uL   RBC 4.48 3.87 - 5.11 Mil/uL   Platelets 237.0 150.0 - 400.0 K/uL   Hemoglobin 14.0 12.0 - 15.0 g/dL   HCT 58.2 63.9 - 53.9 %   MCV 93.0 78.0 - 100.0 fl   MCHC 33.6 30.0 - 36.0 g/dL   RDW 86.3 88.4 - 84.4 %  Tissue transglutaminase, IgA   Collection Time: 01/06/24  4:02 PM  Result Value Ref Range   (tTG) Ab, IgA 1.0 U/mL  IgA   Collection Time: 01/06/24  4:02 PM  Result Value Ref Range   Immunoglobulin A 213 47 - 310 mg/dL   Labs Reviewed - No data to display  Imaging: No results found.   No Known Allergies                                             Past Medical History:  Diagnosis Date   Anal fissure    Anxiety    Bowel obstruction (HCC)    Cholestasis of pregnancy in third trimester    Chronic headaches    Hx of chlamydia infection    IBS (irritable bowel syndrome)    Ulcerative colitis (HCC)     Social History   Socioeconomic History  Marital status: Single    Spouse name: Not on file   Number of children: 1   Years of education: Not on file   Highest education level: GED or equivalent  Occupational History   Occupation: server  Tobacco Use   Smoking status: Never    Passive exposure: Never   Smokeless tobacco: Never  Vaping Use   Vaping status: Never Used  Substance and Sexual Activity   Alcohol use: No    Alcohol/week: 0.0 standard drinks of alcohol   Drug use: No   Sexual activity: Yes  Other Topics Concern   Not on file  Social History Narrative   ** Merged History Encounter **       Social Drivers of Health   Financial Resource Strain: High Risk (12/25/2023)   Overall Financial Resource Strain (CARDIA)    Difficulty of Paying Living Expenses: Hard  Food Insecurity: Food Insecurity Present (12/25/2023)   Hunger Vital Sign     Worried About Running Out of Food in the Last Year: Sometimes true    Ran Out of Food in the Last Year: Sometimes true  Transportation Needs: No Transportation Needs (12/25/2023)   PRAPARE - Administrator, Civil Service (Medical): No    Lack of Transportation (Non-Medical): No  Physical Activity: Sufficiently Active (12/25/2023)   Exercise Vital Sign    Days of Exercise per Week: 7 days    Minutes of Exercise per Session: 90 min  Stress: Stress Concern Present (12/25/2023)   [employer] of Occupational Health - Occupational Stress Questionnaire    Feeling of Stress: To some extent  Social Connections: Socially Isolated (12/25/2023)   Social Connection and Isolation Panel    Frequency of Communication with Friends and Family: Once a week    Frequency of Social Gatherings with Friends and Family: Never    Attends Religious Services: Never    Diplomatic Services Operational Officer: No    Attends Engineer, Structural: Not on file    Marital Status: Never married  Catering Manager Violence: Not on file    Family History  Problem Relation Age of Onset   Asthma Mother    Diabetes Maternal Grandmother    Alcohol abuse Neg Hx    Arthritis Neg Hx    Birth defects Neg Hx    Cancer Neg Hx    COPD Neg Hx    Depression Neg Hx    Drug abuse Neg Hx    Early death Neg Hx    Hearing loss Neg Hx    Heart disease Neg Hx    Hyperlipidemia Neg Hx    Hypertension Neg Hx    Kidney disease Neg Hx    Learning disabilities Neg Hx    Mental illness Neg Hx    Mental retardation Neg Hx    Miscarriages / Stillbirths Neg Hx    Stroke Neg Hx    Vision loss Neg Hx    Varicose Veins Neg Hx      Rolinda Rogue, MD 03/29/24 1143

## 2024-03-29 NOTE — ED Triage Notes (Signed)
 Patient states she has recurrent hemorrhoids since 2016 and has been using Preparation H. Patient states her last bowel movement was yesterday.

## 2024-03-29 NOTE — ED Triage Notes (Signed)
 H/o SBO.

## 2024-03-29 NOTE — Discharge Instructions (Signed)
 Occasional use of over the counter Preparation H with Lidocaine  cream may help when your hemorrhoid is very painful.

## 2024-03-29 NOTE — Addendum Note (Signed)
 Addended by: VICCI SOBER B on: 03/29/2024 12:36 PM   Modules accepted: Orders

## 2024-03-29 NOTE — Telephone Encounter (Signed)
GYN referral submitted

## 2024-03-30 ENCOUNTER — Ambulatory Visit (HOSPITAL_COMMUNITY)
Admission: RE | Admit: 2024-03-30 | Discharge: 2024-03-30 | Payer: Self-pay | Attending: Internal Medicine | Admitting: Internal Medicine

## 2024-03-30 DIAGNOSIS — K638219 Small intestinal bacterial overgrowth, unspecified: Secondary | ICD-10-CM | POA: Insufficient documentation

## 2024-03-30 DIAGNOSIS — R1032 Left lower quadrant pain: Secondary | ICD-10-CM | POA: Insufficient documentation

## 2024-03-30 DIAGNOSIS — R194 Change in bowel habit: Secondary | ICD-10-CM | POA: Insufficient documentation

## 2024-03-30 MED ORDER — IOHEXOL 300 MG/ML  SOLN
80.0000 mL | Freq: Once | INTRAMUSCULAR | Status: AC | PRN
  Administered 2024-03-30: 100 mL via INTRAVENOUS

## 2024-03-30 MED ORDER — IOHEXOL 9 MG/ML PO SOLN
1000.0000 mL | ORAL | Status: AC
  Administered 2024-03-30: 1000 mL via ORAL

## 2024-03-30 NOTE — Telephone Encounter (Signed)
 Called & spoke to the patient. Verified name & DOB. Informed that the Gyn referral was placed to the requested site. Patient expressed verbal understanding.

## 2024-04-03 ENCOUNTER — Ambulatory Visit: Payer: Self-pay | Admitting: Podiatry

## 2024-04-03 ENCOUNTER — Other Ambulatory Visit: Payer: Self-pay

## 2024-04-03 ENCOUNTER — Ambulatory Visit (INDEPENDENT_AMBULATORY_CARE_PROVIDER_SITE_OTHER): Payer: Self-pay

## 2024-04-03 ENCOUNTER — Encounter: Payer: Self-pay | Admitting: Podiatry

## 2024-04-03 DIAGNOSIS — M722 Plantar fascial fibromatosis: Secondary | ICD-10-CM

## 2024-04-03 MED ORDER — TRIAMCINOLONE ACETONIDE 10 MG/ML IJ SUSP
10.0000 mg | Freq: Once | INTRAMUSCULAR | Status: AC
  Administered 2024-04-03: 10 mg via INTRA_ARTICULAR

## 2024-04-03 MED ORDER — DICLOFENAC SODIUM 75 MG PO TBEC
75.0000 mg | DELAYED_RELEASE_TABLET | Freq: Two times a day (BID) | ORAL | 2 refills | Status: AC
  Filled 2024-04-03: qty 50, 25d supply, fill #0

## 2024-04-03 NOTE — Patient Instructions (Signed)

## 2024-04-03 NOTE — Progress Notes (Signed)
 Subjective:   Patient ID: Alicia Cochran, female   DOB: 33 y.o.   MRN: [id number]   HPI Patient presents with numerous year history of pain in the arch of both feet and states the right hurts worse than the left and its been going on for at least 4 years she does not remember injury.  She has tried different lifts and over-the-counter inserts without relief of symptoms and patient does not smoke likes to be active   Review of Systems  All other systems reviewed and are negative.       Objective:  Physical Exam Vitals and nursing note reviewed.  Constitutional:      Appearance: She is well-developed.  Pulmonary:     Effort: Pulmonary effort is normal.  Musculoskeletal:        General: Normal range of motion.  Skin:    General: Skin is warm.  Neurological:     Mental Status: She is alert.     Neurovascular status found to be intact muscle strength found to be adequate range of motion adequate with patient found to have exquisite discomfort in the mid arch area right over left fluid buildup noted with a tightness to the plantar fascia.  Also seems to have some neurological symptoms but difficult to make a determination.     Assessment:  Possibility for mid arch fasciitis bilateral with abnormal structure and function of the posterior tibial tendon with possibility for neuropathy or nerve condition also     Plan:  H&P x-rays reviewed conditions discussed and I went ahead sterile prep did mid arch injection bilateral 3 mg Kenalog  5 mg Xylocaine  applied fascial brace right placed on diclofenac  I will see back 4 weeks and depending on response we may consider other treatments.  All questions answered  X-rays indicate high arch foot structure no other indications of pathology

## 2024-04-05 ENCOUNTER — Ambulatory Visit: Payer: Self-pay | Admitting: Internal Medicine

## 2024-04-07 ENCOUNTER — Telehealth: Payer: Self-pay | Admitting: Lab

## 2024-04-07 ENCOUNTER — Telehealth: Payer: Self-pay | Admitting: Internal Medicine

## 2024-04-07 NOTE — Telephone Encounter (Signed)
 Patient states had injection 04/03/2024 foot hurts worse unable to bed would like pain medication sent to pharmacy that would help with inflammation please advise. Patient has to return to work today should she go in or stay home?

## 2024-04-07 NOTE — Telephone Encounter (Unsigned)
 Copied from CRM #[id number]. Topic: Clinical - Refused Triage >> Apr 07, 2024  9:55 AM Terri G wrote: Patient/caller voiced complaints of extreme pain in upper area of abdominal. Can barely sleep. Declined transfer to triage.   ----------------------------------------------------------------------- From previous Reason for Contact - Refused Triage: Patient/caller voiced complaints of Extreme pain left upper side of abdominal. Can barely sleep. Declined transfer to triage.

## 2024-04-07 NOTE — Telephone Encounter (Signed)
 Copied from CRM #[id number]. Topic: Referral - Question >> Mar 28, 2024  2:39 PM Anairis L wrote:  Reason for CRM: Patient is requesting a OBGYN referral not GI, she has seen a GI provider and they suggested she also see a OBGYN.  Please give patient a call.  >> Apr 07, 2024  9:50 AM Terri MATSU wrote:  Patient is calling regarding referral. Advised her on the note that was left on 12/4 that referral was placed. And advised her on the turnaround time 7-10 business days. Patient stated she hasn't heard from anyone and still in a bunch of pain.

## 2024-04-07 NOTE — Telephone Encounter (Addendum)
 Contacted patient and informed her that the referral placed for Gynecology. Referral information was given. Patient acknowledged and understood.

## 2024-04-07 NOTE — Telephone Encounter (Signed)
 Called & spoke to the patient. Verified name & DOB. Inquired if patient would like an appointment with Dr.Johnson or any available provider. Patient stated that she was experiencing a chronic pain and would not like to be seen at this time. Patient called to get the phone number for gynecology to address the chronic pain and has since made contact & scheduled an appointment to be seen 05/12/23. Advised patient to go to [facility] or ER if pain worsens. Patient expressed verbal understanding of all discussed.

## 2024-04-10 ENCOUNTER — Other Ambulatory Visit: Payer: Self-pay | Admitting: Podiatry

## 2024-04-10 NOTE — Telephone Encounter (Signed)
 I wrote her for antinflamatory. If not improved should come in this week

## 2024-04-12 ENCOUNTER — Ambulatory Visit: Payer: Self-pay | Admitting: Podiatry

## 2024-04-12 ENCOUNTER — Other Ambulatory Visit: Payer: Self-pay

## 2024-04-12 ENCOUNTER — Encounter: Payer: Self-pay | Admitting: Podiatry

## 2024-04-12 VITALS — Ht 59.0 in | Wt 113.0 lb

## 2024-04-12 DIAGNOSIS — M722 Plantar fascial fibromatosis: Secondary | ICD-10-CM

## 2024-04-12 MED FILL — Prednisone Tab 10 MG: ORAL | 12 days supply | Qty: 48 | Fill #0 | Status: AC

## 2024-04-12 NOTE — Progress Notes (Signed)
 Subjective:   Patient ID: Alicia Cochran, female   DOB: 33 y.o.   MRN: [id number]   HPI Patient presents stating still having a lot of pain and states the right foot is really bothering her.  Patient states the entire foot hurts and makes it difficult to bear weight    ROS      Objective:  Physical Exam  Neurovascular status is intact muscle strength found to be adequate range of motion adequate with the patient having negative Toula' sign.  Has no back pain at the current time the foot is painful in both the central lateral portion and into the arch also     Assessment:  Difficult to say there is no obvious swelling or no obvious stress or no what appears to be nerve compression just pain     Plan:  H&P reviewed I am going to have her order a air fracture walker online to try to immobilize the foot and take some of the weight off of it and hopefully reduce the inflammation and I went ahead today and I placed on a Sterapred DS 12-day Dosepak to try to reduce inflammatory cycle     I

## 2024-04-24 ENCOUNTER — Other Ambulatory Visit: Payer: Self-pay

## 2024-05-08 ENCOUNTER — Ambulatory Visit (INDEPENDENT_AMBULATORY_CARE_PROVIDER_SITE_OTHER): Payer: Self-pay | Admitting: Podiatry

## 2024-05-08 DIAGNOSIS — Z91199 Patient's noncompliance with other medical treatment and regimen due to unspecified reason: Secondary | ICD-10-CM

## 2024-05-08 NOTE — Progress Notes (Signed)
 Patient did not come in or cancel

## 2024-05-10 ENCOUNTER — Ambulatory Visit: Payer: Self-pay | Admitting: Podiatry

## 2024-05-11 ENCOUNTER — Encounter: Payer: Self-pay | Admitting: Obstetrics and Gynecology

## 2024-05-11 ENCOUNTER — Ambulatory Visit (INDEPENDENT_AMBULATORY_CARE_PROVIDER_SITE_OTHER): Payer: Self-pay | Admitting: Obstetrics and Gynecology

## 2024-05-11 VITALS — BP 110/70 | HR 64 | Ht 59.0 in | Wt 115.8 lb

## 2024-05-11 DIAGNOSIS — R102 Pelvic and perineal pain unspecified side: Secondary | ICD-10-CM

## 2024-05-11 DIAGNOSIS — N898 Other specified noninflammatory disorders of vagina: Secondary | ICD-10-CM

## 2024-05-11 DIAGNOSIS — R140 Abdominal distension (gaseous): Secondary | ICD-10-CM

## 2024-05-11 DIAGNOSIS — R351 Nocturia: Secondary | ICD-10-CM

## 2024-05-11 DIAGNOSIS — R1012 Left upper quadrant pain: Secondary | ICD-10-CM

## 2024-05-11 DIAGNOSIS — R150 Incomplete defecation: Secondary | ICD-10-CM

## 2024-05-11 DIAGNOSIS — N393 Stress incontinence (female) (male): Secondary | ICD-10-CM

## 2024-05-11 DIAGNOSIS — M6289 Other specified disorders of muscle: Secondary | ICD-10-CM

## 2024-05-11 NOTE — Progress Notes (Signed)
 Last PAP less than 1 year ago, reports normal results.   Pt scored 7 on PHQ and 5 on GAD; referral sent  Pt had colonoscopy 11/2022. Pt reports bloating, constipation, pain during intercourse, urine frequency. She states she doesn't feel like she empties her bowels.  Reports CT scan 3 weeks ago for abdominal pain and bloating. She states none of these test have found any reason for her symptoms.   She was referred to GYN by gastroenterologist.   Pt reports symptoms started in 2016 following birth of her son.   Her bladder was expanded in 2019. That didn't help either.   H. Pylori (3 years ago) and sibo (02/2024) was found.

## 2024-05-11 NOTE — Progress Notes (Signed)
 "  GYNECOLOGY OFFICE NOTE  History:  34 y.o. G2P1011 here today for pelvic pain. Reports had pressure during pregnancy, then no sex for 2 years, then hydro-distention due to urinary frequency in 2019. Four years ago started having gassiness, bloating, pants are not fitting anymore, reports she has to lay on her side to pass gas, saw GI, dx with H pylori, treated and then tested negative. 11/2022, had colonoscopy for constipation and is not completely emptying her bowels, still feels bloated. Says stomach on left side makes a weird noise. Took omeprazole  per GI for 1.5 years and gained weight. Bloating did not improve.   She has tried different diet, bloating and feels like she is not completely emptying. Feels full all the time, feels appetite is decreased. Pain is worse at night, on left side in mid-abdomen, rates 7/10, burning and always in same site.  Reports she feels like there is a gas bubble in her abdomen. Tried miralax  and linzess  with no improvement.  Also having pain with intercourse, particularly with deep thrusting. Feels pain in her rectum with intercourse as well, just feels sore. She is having vaginal intercourse, no anal intercourse but still has pelvic floor discomfort. Also with some vaginal dryness, has trouble focusing during intercourse, does not feel much sensation.  She gets up multiple times per night to urinate with urge, does have pain and pressure. Having trouble sleeping due to this and was started on a medication for depression but has stopped taking this due to it making her sleepy and feel tired all the time. She feels it did not help her other symptoms and she is feeling much better since she stopped. Only takes trazodone  occasionally to help her sleep.   Urinary leakage with sneezing/coughing. Also feels like she is not completely emptying when she urinates.    Taking several teas and magnesium. Also reports loose stools as well.   Past Medical History:  Diagnosis  Date   Anal fissure    Anxiety    Bowel obstruction (HCC)    Cholestasis of pregnancy in third trimester    Chronic headaches    Hx of chlamydia infection    IBS (irritable bowel syndrome)    Ulcerative colitis (HCC)    Past Surgical History:  Procedure Laterality Date   CYSTO WITH HYDRODISTENSION N/A 10/07/2018   Procedure: CYSTOSCOPY/HYDRODISTENSION INSTILL MARCAINE  AND PYRIDIUM ;  Surgeon: Watt Rush, MD;  Location: [facility];  Service: Urology;  Laterality: N/A;   expanded bladder     Current Medications[1]  The following portions of the patient's history were reviewed and updated as appropriate: allergies, current medications, past family history, past medical history, past social history, past surgical history and problem list.   Review of Systems:  Pertinent items noted in HPI and remainder of comprehensive ROS otherwise negative.   Objective:  Physical Exam BP 110/70   Pulse 64   Ht 4' 11 (1.499 m)   Wt 115 lb 12.8 oz (52.5 kg)   LMP 04/30/2024 (Exact Date)   BMI 23.39 kg/m  CONSTITUTIONAL: Well-developed, well-nourished female in no acute distress.  HENT:  Normocephalic, atraumatic. External right and left ear normal. Oropharynx is clear and moist EYES: Conjunctivae and EOM are normal. Pupils are equal, round, and reactive to light. No scleral icterus.  NECK: Normal range of motion, supple, no masses SKIN: Skin is warm and dry. No rash noted. Not diaphoretic. No erythema. No pallor. NEUROLOGIC: Alert and oriented to person, place, and time. Normal reflexes, muscle  tone coordination. No cranial nerve deficit noted. PSYCHIATRIC: Normal mood and affect. Normal behavior. Normal judgment and thought content. CARDIOVASCULAR: Normal heart rate noted RESPIRATORY: Effort normal, no problems with respiration noted ABDOMEN: Soft, no distention noted.  Pt points to upper left abdomen when describing her sharp, burning pain PELVIC: deferred per patient MUSCULOSKELETAL: Normal  range of motion. No edema noted.  Labs and Imaging No results found.  Assessment & Plan:  1. Pelvic pain in female (Primary)  2. Pelvic floor dysfunction -Ongoing pain and pressure, particularly with intercourse and soreness in rectum that started after last delivery -refer to PFPT - reviewed need for strengthening pelvic floor muscles, can see specialist, she is agreeable  3. Urinary, incontinence, stress female Referral to Urogyn  4. Incomplete passage of stool -Has had extensive GI workup including colonoscopy and trial of medications for constipation -per chart, bloating symptoms improved on antibiotics very briefly and then returned - CT/AP negative - will defer to GI  5. Abdominal bloating  6. Vaginal dryness Can start using lubrication prn  7. Left upper quadrant abdominal pain Unclear source of pain, based on location indicated by patient, low ddx that this is gyn in nature however will obtain US  to rule out cysts/other sources of pain   8. Frequent urination at night   Routine preventative health maintenance measures emphasized. Please refer to After Visit Summary for other counseling recommendations.   Return for prn.   K. Yolanda Moats, MD, [facility] Attending Center for [facility])       [1]  Current Outpatient Medications:    traZODone  (DESYREL ) 100 MG tablet, Take 1 tablet (100 mg total) by mouth at bedtime as needed., Disp: 90 tablet, Rfl: 1   diclofenac  (VOLTAREN ) 75 MG EC tablet, Take 1 tablet (75 mg total) by mouth 2 (two) times daily. (Patient not taking: Reported on 05/11/2024), Disp: 50 tablet, Rfl: 2   fluconazole  (DIFLUCAN ) 150 MG tablet, Take 1 now and the second after completing metronidazole  (Patient not taking: Reported on 05/11/2024), Disp: 2 tablet, Rfl: 0   hydrocortisone  (ANUSOL -HC) 25 MG suppository, Place 1 suppository (25 mg total) rectally every 12 (twelve) hours. (Patient not taking: Reported on 05/11/2024),  Disp: 12 suppository, Rfl: 0   linaclotide  (LINZESS ) 72 MCG capsule, Take 1 capsule (72 mcg total) by mouth daily before breakfast. (Patient not taking: Reported on 05/11/2024), Disp: 30 capsule, Rfl: 1   loratadine  (CLARITIN ) 10 MG tablet, Take 1 tablet (10 mg total) by mouth daily. (Patient not taking: Reported on 05/11/2024), Disp: 30 tablet, Rfl: 3   metroNIDAZOLE  (FLAGYL ) 250 MG tablet, Take 1 tablet (250 mg total) by mouth 3 (three) times daily. (Patient not taking: Reported on 05/11/2024), Disp: 30 tablet, Rfl: 1   olopatadine  (PATANOL) 0.1 % ophthalmic solution, Place 1 drop into both eyes 2 (two) times daily. (Patient not taking: Reported on 05/11/2024), Disp: 5 mL, Rfl: 12   polyethylene glycol powder (GLYCOLAX /MIRALAX ) 17 GM/SCOOP powder, Take 8.5 g by mouth daily. Dissolve 1/2 capful (8.5g) in 4-8 ounces of liquid and take by mouth daily. (Patient not taking: Reported on 05/11/2024), Disp: 3350 g, Rfl: 1   Simethicone  125 MG CAPS, Take 1 capsule (125 mg total) by mouth 3 (three) times daily as needed. (Patient not taking: Reported on 05/11/2024), Disp: 90 capsule, Rfl: 2  "

## 2024-07-04 ENCOUNTER — Ambulatory Visit: Payer: Self-pay
